# Patient Record
Sex: Female | Born: 1975 | Race: White | Hispanic: No | Marital: Single | Smoking: Never smoker
Health system: Western US, Academic
[De-identification: ages and names within clinical notes are randomized; demographics above are authoritative.]

---

## 2012-10-07 ENCOUNTER — Telehealth: Payer: Self-pay | Admitting: Family Medicine

## 2012-10-07 ENCOUNTER — Ambulatory Visit: Payer: Self-pay | Admitting: Family Medicine

## 2012-10-07 ENCOUNTER — Encounter: Payer: Self-pay | Admitting: Family Medicine

## 2012-10-07 ENCOUNTER — Ambulatory Visit: Payer: Self-pay

## 2012-10-07 VITALS — BP 104/60 | HR 64 | Temp 98.8°F | Resp 16 | Ht 63.5 in | Wt 174.4 lb

## 2012-10-07 DIAGNOSIS — B1920 Unspecified viral hepatitis C without hepatic coma: Secondary | ICD-10-CM | POA: Insufficient documentation

## 2012-10-07 DIAGNOSIS — F32A Depression, unspecified: Secondary | ICD-10-CM | POA: Insufficient documentation

## 2012-10-07 DIAGNOSIS — R519 Headache, unspecified: Secondary | ICD-10-CM | POA: Insufficient documentation

## 2012-10-07 DIAGNOSIS — G44209 Tension-type headache, unspecified, not intractable: Secondary | ICD-10-CM | POA: Insufficient documentation

## 2012-10-07 DIAGNOSIS — E282 Polycystic ovarian syndrome: Secondary | ICD-10-CM | POA: Insufficient documentation

## 2012-10-07 MED ORDER — ALPRAZOLAM 0.5 MG TABLET
0.5000 mg | ORAL_TABLET | Freq: Four times a day (QID) | ORAL | Status: DC | PRN
Start: 2012-10-07 — End: 2013-04-08

## 2012-10-07 MED ORDER — FLUTICASONE PROPIONATE 50 MCG/ACTUATION NASAL SPRAY,SUSPENSION
1.0000 | Freq: Every day | NASAL | Status: AC
Start: 2012-10-07 — End: 2013-10-07

## 2012-10-07 MED ORDER — ESCITALOPRAM 10 MG TABLET
10.0000 mg | ORAL_TABLET | Freq: Every day | ORAL | Status: DC
Start: 2012-10-07 — End: 2012-11-02

## 2012-10-07 NOTE — Telephone Encounter (Signed)
Patient called and stated that she would like to get in today. She is a new patient and has not yet been seen. Patient has been on medication Celexa for 2 years and now feels that the medication is not working and would like to discuss changing the medication. Please call and advise. No openings on schedule today for a 30 minute New patient slot or Morning A slot.     Carrington Clamp  [facility]

## 2012-10-07 NOTE — Nursing Note (Signed)
>>   Amber BRADFORD, MA     Wed Oct 07, 2012  4:13 PM  Vitals taken, allergies verified, screened for pain. [place], [place]

## 2012-10-07 NOTE — Patient Instructions (Addendum)
Vertigo: Exercises  Your Care Instructions  Here are some examples of typical rehabilitation exercises for your condition. Start each exercise slowly. Ease off the exercise if you start to have pain.  Your doctor or physical therapist will tell you when you can start these exercises and which ones will work best for you.  How to do the exercises  Note: Do these exercises twice a day. Try to progress to doing each head movement 15 to 20 times. Then try to do them with your eyes closed.  Exercise 1    1. Stand with a chair in front of you and a wall behind you. If you begin to fall, you may use them for support.  2. Stand with your feet together and your arms at your sides.  Exercise 2    Move your head side to side 10 times.  Exercise 3    Move your head diagonally up and down 10 times.  Exercise 4    Move your head diagonally up and down 10 times on the other side.  Follow-up care is a key part of your treatment and safety. Be sure to make and go to all appointments, and call your doctor if you are having problems. It's also a good idea to know your test results and keep a list of the medicines you take.   Where can you learn more?   Go to https://www.healthwise.net/patiented  Enter F349 in the search box to learn more about "Vertigo: Exercises."    2006-2013 Healthwise, Incorporated. Care instructions adapted under license by [facility]. This care instruction is for use with your licensed healthcare professional. If you have questions about a medical condition or this instruction, always ask your healthcare professional. Healthwise, Incorporated disclaims any warranty or liability for your use of this information.  Content Version: 9.7.145117; Last Revised: June 25, 2011

## 2012-10-07 NOTE — Progress Notes (Addendum)
Chief Complaint   Patient presents with    Establish Care        SUBJECTIVE: Amber Wang is a 37yr old female who presents to get established  Moved from [place] 2 months ago with fianc, children and mother is helping to care for her children.  She's working in and normal lab at [employer]  Father children is working at [employer]    She complains of considerable stress at work, supervising 14 people  Frequent tension headaches, also sinus pressure no nasal discharge    Recent diagnosis with hepatitis C in February found qualifying for life insurance  Reports elevated liver enzymes no focal symptoms, history of IV drug use for 3 months in the remote past    Reports panic attacks 3 times per week as well as generalized anxiety  Previously maintained citalopram 20 mg by mouth daily with good results then tachyphylaxis, self DC'd a couple months ago.     family history includes Arthritis in her mother and Hypertension in her father and sister.  History   Substance Use Topics    Smoking status: Never Smoker     Smokeless tobacco: Not on file    Alcohol Use: Not on file     Past Surgical History   Procedure Laterality Date    Rc c-section       Family History   Problem Relation Age of Onset    Hypertension Father     Hypertension Sister     Arthritis Mother      Patient Active Problem List   Diagnosis    Hepatitis C    PCOS (polycystic ovarian syndrome)    Headache disorder    Anxiety and depression         Current outpatient prescriptions:Fluticasone (FLONASE) 50 mcg/actuation nasal spray, Instill 1-2 sprays into EACH nostril every day. (allergies), Disp: 16 g, Rfl: 5     R.O.S. See new patient questionnaire    P. HQ 9 equal to 8  Gad 7 equal to 18  BP 104/60  Pulse 64  Temp(Src) 37.1 C (98.8 F) (Oral)  Resp 16  Ht 1.613 m (5' 3.5")  Wt 79.107 kg (174 lb 6.4 oz)  BMI 30.41 kg/m2  LMP 10/01/2012     OBJECTIVE: General Appearance: Alert, oriented, cooperative, no apparent distress.  Lungs: Good air  entry, no rales or wheezing.  Heart: Regular rate and rhythm. No murmurs, extra heart sounds.  No cyanosis, cords or edema  Mental status exam; she is alert, orient to time, person and place. Normal thought content, speech, affect, mood and dress are noted.    ASSESSMENT:    (070.70) Hepatitis C  (primary encounter diagnosis) with history of IV drug use x3 months in the remote past  Comment: New diagnosis, advised to avoid alcohol and Tylenol  Husband to get tested as well  Plan: HEPATOLOGY REFERRAL        Patient to transfer records    (784.0) Headache disorder  Comment: Likely combined tension and sinus  Plan: Treat anxiety as below  Flonase  Followup at CPE in 2 weeks    (386.11) Vertigo, benign positional  Comment: Probable  Plan: see patient education   Call if symptoms worsen, or fail to resolve as anticipated.     (300.4) Anxiety and depression  Comment: Begin Lexapro with alprazolam when necessary   Call if symptoms worsen, followup one to 2 weeks    30 minutes spent face-to-face reviewing history, examining patient, and coordinating  care.       I reviewed patient's past medical and social history.  Barriers to Learning assessed: none. Patient verbalizes understanding of teaching and instructions.  Orlene Salmons Alvie Heidelberg, MD

## 2012-10-07 NOTE — Telephone Encounter (Signed)
Called patient . She is at home today with her mother ,cannot be at work. She is experiencing severe anxiety attacks. Patient reported of recent Hep C diagnosis. Her current mediction not effective. She must have work letter for anxiety related issues . Patient is new and provided NP visit today . This is d/t severe anxiety attacks and patient decline to speak with nurse. Patient scheduled today at 1600,30 minutes .pt advised to arrive 15-20 minutes prior to appointment for registration.Madelon Lips, LVN

## 2012-11-02 ENCOUNTER — Encounter: Payer: Self-pay | Admitting: Family Medicine

## 2012-11-02 ENCOUNTER — Ambulatory Visit: Payer: Self-pay | Admitting: Family Medicine

## 2012-11-02 VITALS — BP 104/64 | HR 62 | Temp 98.9°F | Wt 181.4 lb

## 2012-11-02 MED ORDER — ESCITALOPRAM 10 MG TABLET
10.0000 mg | ORAL_TABLET | Freq: Every day | ORAL | Status: DC
Start: 2012-11-02 — End: 2013-11-02

## 2012-11-02 NOTE — Patient Instructions (Signed)
Ankle: Exercises  Your Care Instructions  Here are some examples of exercises for your ankle. Start each exercise slowly. Ease off the exercise if you start to have pain.  Your doctor or physical therapist will tell you when you can start these exercises and which ones will work best for you.  How to do the exercises  "Alphabet" exercise    1. Trace the alphabet with your toe. This helps your ankle move in all directions.  Side-to-side knee swing exercise    1. Sit in a chair with your foot flat on the floor.  2. Slowly move your knee from side to side while keeping your foot pressed flat.  3. Continue this exercise for 2 to 3 minutes.  Towel curl    1. While sitting, place your foot on a towel on the floor and scrunch the towel toward you with your toes.  2. Then use your toes to push the towel away from you.  3. Make this exercise more challenging by placing a weighted object, such as a soup can, on the other end of the towel.  Towel stretch    1. Sit with your legs extended and knees straight.  2. Place an elastic band or towel around your foot just under the toes. A towel will give you a more effective stretch.  3. Hold each end of the towel or band in each hand, with your hands above your knees.  4. Pull back with the towel or band so that your foot stretches toward you.  5. Hold the position for at least 15 to 30 seconds.  6. Repeat 2 to 4 times a session, up to 5 sessions a day.  Ankle eversion exercise    1. Start by sitting with your foot flat on the floor and pushing it outward against an immovable object such as the wall or heavy furniture.  2. After you feel comfortable with this, try using rubber tubing looped around the outside of your feet for resistance. Rubber tubing, also called surgical tubing, is like a big rubber band. When held firmly on one end, it offers resistance as you stretch the other side.  Isometric opposition exercises    1. While sitting, put your feet together flat on the  floor.  2. Press your injured foot inward against your other foot.  3. Hold for about 6 seconds, and relax.  4. Then place the heel of your other foot on top of the injured one. Push down with the top heel while trying to push up with your injured foot.  Follow-up care is a key part of your treatment and safety. Be sure to make and go to all appointments, and call your doctor if you are having problems. It's also a good idea to know your test results and keep a list of the medicines you take.   Where can you learn more?   Go to [employer]  Enter R730 in the search box to learn more about "Ankle: Exercises."    2006-2013 Healthwise, Incorporated. Care instructions adapted under license by [facility]. This care instruction is for use with your licensed healthcare professional. If you have questions about a medical condition or this instruction, always ask your healthcare professional. Healthwise, Incorporated disclaims any warranty or liability for your use of this information.  Content Version: 9.7.145117; Last Revised: March 09, 2010              Ankle Sprain: After Your Visit  Your Care Instructions  An ankle sprain can happen when you twist your ankle. The ligaments that support the ankle can get stretched and torn. Often the ankle is swollen and painful.  Ankle sprains may take from several weeks to several months to heal. Usually, the more pain and swelling you have, the more severe your ankle sprain is and the longer it will take to heal. You can heal faster and regain strength in your ankle with good home treatment.  It is very important to give your ankle time to heal completely, so that you do not easily hurt your ankle again.  Follow-up care is a key part of your treatment and safety. Be sure to make and go to all appointments, and call your doctor if you are having problems. It's also a good idea to know your test results and keep a list of the medicines you  take.  How can you care for yourself at home?   Prop up your foot on pillows as much as possible for the next 3 days. Try to keep your ankle above the level of your heart. This will help reduce the swelling.   Follow your doctor's directions for wearing a splint or elastic bandage. Wrapping the ankle may help reduce or prevent swelling.   Your doctor may give you a splint, a brace, an air stirrup, or another form of ankle support to protect your ankle until it is healed. Wear it as directed while your ankle is healing. Do not remove it unless your doctor tells you to. After your ankle has healed, ask your doctor whether you should wear the brace when you exercise.   Put ice or cold packs on your injured ankle for 10 to 20 minutes at a time. Try to do this every 1 to 2 hours for the next 3 days (when you are awake) or until the swelling goes down. Put a thin cloth between the ice and your skin.   You may need to use crutches until you can walk without pain. If you do use crutches, try to bear some weight on your injured ankle if you can do so without pain. This helps the ankle heal.   Take pain medicines exactly as directed.   If the doctor gave you a prescription medicine for pain, take it as prescribed.   If you are not taking a prescription pain medicine, ask your doctor if you can take an over-the-counter medicine.   If you have been given ankle exercises to do at home, do them exactly as instructed. These can promote healing and help prevent lasting weakness.  When should you call for help?  Call your doctor now or seek immediate medical care if:   Your pain is getting worse.   Your swelling is getting worse.   Your splint feels too tight or you are unable to loosen it.  Watch closely for changes in your health, and be sure to contact your doctor if:   You are not getting better after 1 week.   Where can you learn more?   Go to [employer]  Enter [phone] in the search box to  learn more about "Ankle Sprain: After Your Visit."    2006-2013 Healthwise, Incorporated. Care instructions adapted under license by [facility]. This care instruction is for use with your licensed healthcare professional. If you have questions about a medical condition or this instruction, always ask your healthcare professional. Healthwise, Incorporated disclaims any warranty or liability  for your use of this information.  Content Version: 9.7.145117; Last Revised: Sep 21, 2009              Acute Low Back Pain: Exercises  Your Care Instructions  Here are some examples of typical rehabilitation exercises for your condition. Start each exercise slowly. Ease off the exercise if you start to have pain.  Your doctor or physical therapist will tell you when you can start these exercises and which ones will work best for you.  When you are not being active, find a comfortable position for rest. Some people are comfortable on the floor or a medium-firm bed with a small pillow under their head and another under their knees. Some people prefer to lie on their side with a pillow between their knees. Don't stay in one position for too long.  Take short walks (10 to 20 minutes) every 2 to 3 hours. Avoid slopes, hills, and stairs until you feel better. Walk only distances you can manage without pain, especially leg pain.  How to do the exercises  Back stretches    1. Get down on your hands and knees on the floor.  2. Relax your head and allow it to droop. Round your back up toward the ceiling until you feel a nice stretch in your upper, middle, and lower back. Hold this stretch for as long as it feels comfortable, or about 15 to 30 seconds.  3. Return to the starting position with a flat back while you are on your hands and knees.  4. Let your back sway by pressing your stomach toward the floor. Lift your buttocks toward the ceiling.  5. Hold this position for 15 to 30 seconds.  6. Repeat 2 to 4 times.  Follow-up  care is a key part of your treatment and safety. Be sure to make and go to all appointments, and call your doctor if you are having problems. It's also a good idea to know your test results and keep a list of the medicines you take.   Where can you learn more?   Go to [employer]  Enter Z071 in the search box to learn more about "Acute Low Back Pain: Exercises."    2006-2013 Healthwise, Incorporated. Care instructions adapted under license by [facility]. This care instruction is for use with your licensed healthcare professional. If you have questions about a medical condition or this instruction, always ask your healthcare professional. Healthwise, Incorporated disclaims any warranty or liability for your use of this information.  Content Version: 9.7.145117; Last Revised: March 20, 2010

## 2012-11-02 NOTE — Nursing Note (Signed)
>>   STACEY RIOS, MA     Mon Nov 02, 2012  9:06 AM  Vital signs taken,tobacco,allergies,pharmacy verified, screened for pain.  [place], [place]

## 2012-11-02 NOTE — Progress Notes (Signed)
11/02/2012   Chief Complaint   Patient presents with    Physical       Amber Wang is a 37yr old female presents for complete physical exam   History of PCO reports history of intermittent right ovarian cyst  Complains of right lower quadrant pain and right buttock   Right ankle inversion yesterday while moving 4/10 pain    Patient Active Problem List   Diagnosis    Hepatitis C    PCOS (polycystic ovarian syndrome)    Headache disorder    Anxiety and depression       Family History   Problem Relation Age of Onset    Hypertension Father     Hypertension Sister     Arthritis Mother      Social History     Social History    Marital Status: SINGLE     Spouse Name: N/A     Number of Children: N/A    Years of Education: N/A     Occupational History    Not on file.     Social History Main Topics    Smoking status: Never Smoker     Smokeless tobacco: Not on file    Alcohol Use: Not on file    Drug Use: Not on file    Sexually Active: Not on file         Review of Systems - see history of present illness.  Also:  Constitutional: no excessive/unexplained fatigue, no unexplained weight loss.  Eyes: no redness or discharge.  no acute vision changes  Ears, Nose, Mouth, Throat: no persistent sore throat, rhinorrhea, ear pain.  CV: no palpitations, chest pain.  Resp: no cough, wheezing.  GI: no dysphagia, change in bowel movements.  GU: no dysuria, urgency.  Musculoskeletal: no AM joint stiffness, muscle weakness.  Integumentary: no moles that have changed, dark lesions.  Neuro: no paralysis/weakness, frequent or severe head aches.  Psych: no depressed mood, anxiety.  Endo: no polydipsia, polyuria.  Heme/Lymphatic: no abnormal bleeding, abnormal bruising from baseline.  Allergy/Immun: no itchy eyes, itchy nose.  GYN: no bleeding between periods, lump in breast.    Patient Active Problem List   Diagnosis    Hepatitis C    PCOS (polycystic ovarian syndrome)    Headache disorder    Anxiety and depression       Current outpatient prescriptions:Alprazolam (XANAX) 0.5 mg Tablet Tablet, Take 1 tablet by mouth every 6 hours if needed (anxiety)., Disp: 20 tablet, Rfl: 0;  Escitalopram (LEXAPRO) 10 mg Tablet, Take 1 tablet by mouth every morning., Disp: 30 tablet, Rfl: 11;  Fluticasone (FLONASE) 50 mcg/actuation nasal spray, Instill 1-2 sprays into EACH nostril every day. (allergies), Disp: 16 g, Rfl: 5   BP 104/64  Pulse 62  Temp(Src) 37.2 C (98.9 F) (Oral)  Wt 82.283 kg (181 lb 6.4 oz)  BMI 31.63 kg/m2  LMP 10/07/2012   GEN: The physical exam is generally normal. Patient appears well, alert and oriented x 3, pleasant, cooperative.   VITALS:  are as noted by nurse.   NECK:  supple and free of adenopathy, or masses. No thyromegaly.   Musculoskeletal: Cervical, thoracic and lumbar spine exam appear normal   EYES: sclera non-injected, no discharge   Ears, Nose, and Throat are normal.   Chest is clear to IPPA.   Heart sounds are normal, no murmurs, clicks, gallops or rubs.  Abdomen is soft, no tenderness, masses or hepatosplenomegaly.    Breasts:  normal without  suspicious masses, skin or nipple changes or axillary nodes, symmetric fibrous changes in both upper outer quadrants, self-exam is taught and encouraged.   Pelvis: no  tenderness,uterus normal size, shape, consistency, no mass or tenderness,adnexa normal in size without mass or tenderness. Pap was not obtained.  Extremities no pitting edema, pulses within normal limits .   Screening neurological exam is normal without focal findings.   Skin is normal without suspicious lesions noted.   Psych: patient appears euthymic without pressured speech.    No results found for any previous visit.     ASSESSMENT:     (V72.31) Well woman exam with routine gynecological exam  (primary encounter diagnosis)  Comment: pap up to date per patient   Plan: patient to request records,she understands we can  review these at a requested office visit or ather  next physical    (300.4)  Anxiety and depression, rare need for xanax  Comment: improved on SSRI  Plan: continue lexapro 10 mg / day  Decline counseling at this time  Call if symptoms worsen, or fail to improve as anticipated.     Ankle sprain, mild   See patient education   Call if symptoms worsen, or fail to resolve as anticipated.     Low back pain , chronic  See patient education   Call if symptoms worsen, or fail to improve as anticipated.     RLQ pain, exam benign  If symptoms increase or persist post menses, recommend repeat pelvic ultrasound.    I reviewed and updated patient's past medical, family and social and history.  Barriers to Learning assessed: none. Patient verbalizes understanding of teaching and instructions.  Leeana Creer Alvie Heidelberg, MD

## 2013-04-08 ENCOUNTER — Ambulatory Visit: Payer: Commercial Managed Care - HMO | Admitting: FAMILY PRACTICE

## 2013-04-08 VITALS — BP 120/80 | HR 82 | Temp 98.1°F | Resp 12 | Wt 155.5 lb

## 2013-04-08 MED ORDER — TRAZODONE 50 MG TABLET
50.0000 mg | ORAL_TABLET | Freq: Every day | ORAL | Status: DC
Start: 2013-04-08 — End: 2013-12-30

## 2013-04-08 MED ORDER — ALPRAZOLAM 0.5 MG TABLET
0.5000 mg | ORAL_TABLET | Freq: Four times a day (QID) | ORAL | Status: DC | PRN
Start: 2013-04-08 — End: 2013-12-30

## 2013-04-08 NOTE — Patient Instructions (Addendum)
You may call [phone] or [phone] to schedule your gastroenterology appointment for hepatitis C evaluation.      Take Lexapro daily.  Take trazodone and xanax as needed only.

## 2013-04-08 NOTE — Nursing Note (Signed)
>>   Madelon Lips, LVN     Thu Apr 08, 2013  3:51 PM  Vital signs taken, allergies verified, screened for pain, med hx taken (if appropriate),   and verified immunization status.  Madelon Lips, LVN,

## 2013-04-08 NOTE — Progress Notes (Signed)
3:56 PM       Chief Complaint   Patient presents with    Anxiety      medication follow up    Referral      change to closer location for hepatology     PI  Amber Wang is a 33yr female who presents for a new problem of increased anxiety and now for the first time, depression.   She has had panic attacks daily again x 2 months now after none for several months.  Xanax was helpful.   She has hepatitis C noted earlier this year for life insurance.   Lexapro has been helpful but seems not to be working now.   Thinks she cannot fit in time for counseling.    Never had panic attack until 2 years ago before big speech.  Used 1/2 tablet Xanax with relief.   Lost 20 lbs since June due to separation and exercise, she states.      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  No changes the next two months.  "I feel like a failure and sad all the time."      REVIEW OF SYSTEMS  PHQ-9 score 18 extremely difficult   MEDICATIONS AS LISTED BEFORE TODAY'S REVIEW  Current outpatient prescriptions:Escitalopram (LEXAPRO) 10 mg Tablet, Take 1 tablet by mouth every morning., Disp: 30 tablet, Rfl: 11;  Fluticasone (FLONASE) 50 mcg/actuation nasal spray, Instill 1-2 sprays into EACH nostril every day. (allergies), Disp: 16 g, Rfl: 5    Patient Active Problem List    Diagnosis Date Noted    Hepatitis C 10/07/2012     Overview Note:     Diagnosed 06/2012      PCOS (polycystic ovarian syndrome) 10/07/2012    Headache disorder 10/07/2012    Anxiety and depression 10/07/2012       OBJECTIVE:        BP 120/80  Pulse 82  Temp(Src) 36.7 C (98.1 F) (Oral)  Resp 12  Wt 70.534 kg (155 lb 8 oz)  BMI 27.11 kg/m2  LMP 03/11/2013      GEN'L  APPEARANCE - healthy young woman in no acute distress    (070.70) Hepatitis C  (primary  encounter diagnosis)  Plan: HCV VIRAL LOAD          (300.4) Anxiety and depression  Plan: Alprazolam (XANAX) 0.5 mg Tablet Tablet; continue Lexapro.  Call if symptoms not improving.  Symptoms likely to improve once marital situation improves.          (780.52) Insomnia  Plan: Trazodone (DESYREL) 50 mg Tablet         Barriers to Learning: none.     Patient/Family Understanding: verbalizes.  Norval Gable, MD    4:16 PM

## 2013-04-19 ENCOUNTER — Encounter: Payer: Self-pay | Admitting: Family Medicine

## 2013-06-16 ENCOUNTER — Ambulatory Visit: Payer: Commercial Managed Care - HMO | Admitting: FAMILY PRACTICE

## 2013-12-30 ENCOUNTER — Encounter: Payer: Self-pay | Admitting: Family Medicine

## 2013-12-30 ENCOUNTER — Ambulatory Visit (INDEPENDENT_AMBULATORY_CARE_PROVIDER_SITE_OTHER): Payer: Commercial Managed Care - HMO

## 2013-12-30 ENCOUNTER — Ambulatory Visit: Payer: Commercial Managed Care - HMO | Admitting: Family Medicine

## 2013-12-30 ENCOUNTER — Ambulatory Visit
Admission: RE | Admit: 2013-12-30 | Discharge: 2013-12-30 | Disposition: A | Discharge status: Home / Self Care | Payer: Commercial Managed Care - HMO | Source: Ambulatory Visit | Attending: Family Medicine | Admitting: Family Medicine

## 2013-12-30 VITALS — BP 114/64 | HR 64 | Temp 97.6°F | Resp 16 | Ht 64.0 in | Wt 151.6 lb

## 2013-12-30 DIAGNOSIS — F341 Dysthymic disorder: Secondary | ICD-10-CM

## 2013-12-30 DIAGNOSIS — B1920 Unspecified viral hepatitis C without hepatic coma: Principal | ICD-10-CM

## 2013-12-30 DIAGNOSIS — S9030XA Contusion of unspecified foot, initial encounter: Principal | ICD-10-CM | POA: Insufficient documentation

## 2013-12-30 DIAGNOSIS — F419 Anxiety disorder, unspecified: Principal | ICD-10-CM

## 2013-12-30 DIAGNOSIS — F32A Depression, unspecified: Secondary | ICD-10-CM

## 2013-12-30 DIAGNOSIS — F329 Major depressive disorder, single episode, unspecified: Principal | ICD-10-CM

## 2013-12-30 MED ORDER — ESCITALOPRAM 20 MG TABLET
20.0000 mg | ORAL_TABLET | Freq: Every day | ORAL | Status: DC
Start: 2013-12-30 — End: 2015-01-08

## 2013-12-30 MED ORDER — ALPRAZOLAM 0.5 MG TABLET
0.5000 mg | ORAL_TABLET | Freq: Every day | ORAL | Status: AC | PRN
Start: 2013-12-30 — End: 2014-03-30

## 2013-12-30 NOTE — Progress Notes (Signed)
S: This is a 51yr female with pmhx of Anxiety, Hepatitis C(Hx of IVDU), PCOS who presents with cc of Anxiety.  Duration: 2-3 years  Character: panic attacks  Worsening Factors: stress  Relieving Factors: Alprazolam 0.25 mg 1/2 tab prn  Previous Treatment: As above, Escitalopram 10 mg daily    ROS: +Left foot pain after droping object on the foot 3 weeks ago, Denies any Weight Loss, Anorexia, Difficulty Sleeping, Fatigue, Dyssomnia, Agoraphobia, Anhedonia, Decreased concentration, Memory Loss, Depressed Mood, Suicidal ideations.    Social History: Tobacco use Denies, ETOH use Denies, Illicit drug use Denies, Occupation: Passenger transport manager, Lives with boyfriend + 2 children    No Known Allergies    O: Temp: 36.4 C (97.6 F) (08/27 1600)  Temp src: Tympanic (08/27 1600)  Pulse: 64 (08/27 1600)  BP: 114/64 mmHg (08/27 1600)  Resp: 16 (08/27 1600)  SpO2: --  Height: 162.6 cm ( ) (08/27 1600)  Weight: 68.765 kg (151 lb 9.6 oz) (08/27 1600)    GEN: This is a 63yr female in NAD. Patient appears non-toxic.  NECK: No palpable masses, No thyromegaly  LUNG: CTAB, No wheezes, rhonchi or rales.  CVS: RR, No murmurs clicks or rubs.  MUSCULO: Left foot ROM is without restriction,+TTP over the mid 5th metatarsal, There is no ecchymosis, hematoma or effusion   PSYCH:   Appearance: neat   General behavior: non-combative, cooperative   Attitude torward examiner: interested   Mood: pleasant,sad   Affect: euthymic   Speech: soft tone, articulate, normal volume   Insight: Good      A/P: Anxiety, Hepatitis C, Left Foot Contusion     EHR reviewed   Escitalopram 20 mg daily+Alprazolam 0.25 mg daily prn   Side effects as well as risks of benzodiazepines overuse discussed     Hep C abs, Hep Viral load pending   Previously vaccinated for Hep B       Left foot film pending   RICE therapy   Treatment plan including medications were reviewed with the patient     RTO in 1 month for recheck     I did review patients past medical and family/social history, no changes noted.  Barriers to Learning assessed: none. Patient verbalizes understanding of teaching and instructions.  Huntley Dec, DO

## 2013-12-30 NOTE — Nursing Note (Signed)
Vitals taken, allergies verified and pain screened.  Quetzaly Ebner, MA

## 2013-12-31 LAB — HEPATITIS C AB SCREEN: HEPATITIS C AB SCREEN: REACTIVE — AB

## 2014-01-03 ENCOUNTER — Other Ambulatory Visit: Payer: Self-pay | Admitting: Family Medicine

## 2014-01-03 DIAGNOSIS — B1920 Unspecified viral hepatitis C without hepatic coma: Principal | ICD-10-CM

## 2014-01-03 LAB — HCV VIRAL LOAD: HCV VIRAL LOAD: 1587841 [IU]/mL

## 2014-01-04 ENCOUNTER — Ambulatory Visit: Payer: Commercial Managed Care - HMO | Admitting: GASTROENTEROLOGY

## 2014-01-08 ENCOUNTER — Encounter: Payer: Self-pay | Admitting: Family Medicine

## 2014-01-11 ENCOUNTER — Ambulatory Visit: Payer: Commercial Managed Care - HMO | Admitting: GASTROENTEROLOGY

## 2014-01-11 ENCOUNTER — Encounter: Payer: Self-pay | Admitting: GASTROENTEROLOGY

## 2014-01-11 VITALS — BP 122/73 | HR 65 | Temp 98.4°F | Resp 18 | Wt 155.0 lb

## 2014-01-11 DIAGNOSIS — B1920 Unspecified viral hepatitis C without hepatic coma: Principal | ICD-10-CM

## 2014-01-11 NOTE — Progress Notes (Signed)
Department of Internal Medicine  Division of Gastroenterology and Hepatology    Hepatology Clinic      NAME: Amber Wang  DOB: 16-Mar-1976  MR#: [id number]       Referring Physician: Lolita Rieger, MD    Source of Information: History was obtained from the patient and after review of medical records.    Chief Complaint: Chronic Hepatitis C    HPI:   Amber Wang is a 55yr year old female with chronic hepatitis C, unknown presents for initial evaluation. She recently moved here from [place]. She notes that she was tested and found to have HCV 1 year ago in [place] (her first knowledge of the test) but did not have a chance for follow-up. She was tested again following a [employer] which was denied and thus followed up. She was likely infected 10-11 years ago when she had used IV recreational drugs and also snorted cocaine. No h/o blood transfusions. She has two children both born after his h/o IVDU.    No past medical history on file.      Past Surgical History   Procedure Laterality Date    Rc c-section          Family History   Problem Relation Age of Onset    Hypertension Father     Hypertension Sister     Arthritis Mother        History     Social History    Marital Status: SINGLE     Spouse Name: N/A     Number of Children: N/A    Years of Education: N/A     Social History Main Topics    Smoking status: Never Smoker     Smokeless tobacco: Not on file    Alcohol Use: Not on file    Drug Use: Not on file    Sexual Activity: Not on file     Other Topics Concern    Not on file     Social History Narrative    Married     Mother of two    Lab Human resources officer       Current Outpatient Prescriptions on File Prior to Visit   Medication Sig Dispense Refill    Alprazolam (XANAX) 0.5 mg Tablet Tablet Take 1 tablet by mouth once daily if needed (anxiety). 20 tablet 0    Escitalopram (LEXAPRO) 20 mg tablet Take 1 tablet by mouth every day. 90 tablet 1     No current facility-administered medications  on file prior to visit.       No Known Allergies        ROS: A complete review of systems was performed and is negative except as mentioned above.    Physical Exam:  BP 122/73 mmHg   Pulse 65   Temp(Src) 36.9 C (98.4 F) (Tympanic)   Resp 18   Wt 70.308 kg (155 lb)  Gen: NAD  HEENT: sclera anicteric  Heart: RRR, S1/S2 nl  Chest: CTAB  Abd: BS+, soft, NT  Ext: no pedal edema    Labs:  Labs reviewed  No results found for: WBC, HGB, HCT, PLT    Lab Results   Lab Name Value Date/Time    NA 138 11/02/2012 10:04 AM    K 4.9 11/02/2012 10:04 AM    CL 110 11/02/2012 10:04 AM    CO2 25 11/02/2012 10:04 AM    BUN 13 11/02/2012 10:04 AM    CR 0.75 11/02/2012 10:04 AM  GLU 94 11/02/2012 10:04 AM     Lab Results   Lab Name Value Date/Time    AST 30 11/02/2012 10:04 AM    ALT 37 11/02/2012 10:04 AM    ALP 41 11/02/2012 10:04 AM    ALB 3.6 11/02/2012 10:04 AM    TP 5.9* 11/02/2012 10:04 AM    TBIL 0.4 11/02/2012 10:04 AM     Lab Results   Component Value Date    HEPCABSCR REACTIVE* 12/30/2013         ASSESSMENT AND RECOMMENDATIONS:  Amber Wang is a 64yr year old female presents today for evaluation for chronic hepatitis C.    1) CHC:    -discussed plan to do labs and also fibroscan to evaluate for fibrosis   -discussed in detail current therapies for HCV based on genotype   -will discuss with our HCV team pharmacists to determine best course to obtain pre-authorization for HCV   -discuss with patient to consider testing her children for HCV    2) HAV/HBV immunization:   -will test for immunity    Celesta Aver, MD  Assistant Professor  Division of Gastroenterology and Hepatology

## 2014-01-11 NOTE — Nursing Note (Signed)
Vital signs taken, allergies verified, screened for pain, med hx taken,  screened for chicken pox, and verified immunization status.  Stefanie Murphy, MA

## 2014-01-12 ENCOUNTER — Encounter: Payer: Self-pay | Admitting: GASTROENTEROLOGY

## 2014-01-18 ENCOUNTER — Ambulatory Visit: Payer: Commercial Managed Care - HMO | Attending: FAMILY PRACTICE

## 2014-01-18 DIAGNOSIS — B1920 Unspecified viral hepatitis C without hepatic coma: Principal | ICD-10-CM | POA: Insufficient documentation

## 2014-01-18 LAB — CBC WITH DIFFERENTIAL
BASOPHILS % AUTO: 0.4 %
BASOPHILS ABS AUTO: 0 10*3/uL (ref 0–0.2)
EOSINOPHIL % AUTO: 2.2 %
EOSINOPHIL ABS AUTO: 0.1 10*3/uL (ref 0–0.5)
HEMATOCRIT: 33.4 % — AB (ref 34–46)
HEMOGLOBIN: 11.3 g/dL — AB (ref 12.0–16.0)
LYMPHOCYTE ABS AUTO: 1.9 10*3/uL (ref 1.0–4.8)
LYMPHOCYTES % AUTO: 33.6 %
MCH: 32.9 pg (ref 27–33)
MCHC: 34 % (ref 32–36)
MCV: 96.8 UM3 (ref 80–100)
MONOCYTES % AUTO: 7.2 %
MONOCYTES ABS AUTO: 0.4 10*3/uL (ref 0.1–0.8)
MPV: 9.5 UM3 (ref 6.8–10.0)
NEUTROPHIL ABS AUTO: 3.2 10*3/uL (ref 1.80–7.70)
NEUTROPHILS % AUTO: 56.6 %
PLATELET COUNT: 214 10*3/uL (ref 130–400)
RDW: 12.4 U (ref 0–14.7)
RED CELL COUNT: 3.45 10*6/uL — AB (ref 3.7–5.5)
WHITE BLOOD CELL COUNT: 5.7 10*3/uL (ref 4.5–11.0)

## 2014-01-18 LAB — COMPREHENSIVE METABOLIC PANEL
ALANINE TRANSFERASE (ALT): 38 U/L (ref 5–54)
ALBUMIN: 3.9 g/dL (ref 3.4–4.8)
ALKALINE PHOSPHATASE (ALP): 38 U/L (ref 35–115)
ASPARTATE TRANSAMINASE (AST): 36 U/L (ref 15–43)
BILIRUBIN TOTAL: 0.4 mg/dL (ref 0.3–1.3)
CALCIUM: 9 mg/dL (ref 8.6–10.5)
CARBON DIOXIDE TOTAL: 26 meq/L (ref 24–32)
CHLORIDE: 106 meq/L (ref 95–110)
CREATININE BLOOD: 0.88 mg/dL (ref 0.44–1.27)
GLUCOSE: 95 mg/dL (ref 70–99)
POTASSIUM: 3.7 meq/L (ref 3.3–5.0)
PROTEIN: 6.3 g/dL (ref 6.3–8.3)
SODIUM: 138 meq/L (ref 135–145)
UREA NITROGEN, BLOOD (BUN): 12 mg/dL (ref 8–22)

## 2014-01-18 LAB — INR: INR: 0.96 (ref 0.87–1.18)

## 2014-01-19 LAB — HEPATITIS A AB, IGG: HEPATITIS A AB, IGG: REACTIVE

## 2014-01-19 LAB — HEPATITIS B SURFACE ANTIGEN: HEPATITIS B SURFACE ANTIGEN: NONREACTIVE

## 2014-01-19 LAB — HEPATITIS B CORE AB, TOTAL: HEPATITIS B CORE AB, TOTAL: NONREACTIVE

## 2014-01-19 LAB — HEPATITIS B SURFACE ANTIBODY: HEPATITIS B SURFACE AB: NONREACTIVE

## 2014-01-19 LAB — HIV AG/AB COMBO SCREEN: HIV AG/AB COMBO SCREEN: NONREACTIVE

## 2014-01-21 ENCOUNTER — Ambulatory Visit: Payer: Commercial Managed Care - HMO | Attending: GASTROENTEROLOGY | Admitting: GASTROENTEROLOGY

## 2014-01-21 VITALS — BP 103/67 | HR 57 | Temp 96.7°F | Ht 64.0 in | Wt 150.0 lb

## 2014-01-21 DIAGNOSIS — B1920 Unspecified viral hepatitis C without hepatic coma: Secondary | ICD-10-CM

## 2014-01-21 DIAGNOSIS — B182 Chronic viral hepatitis C: Principal | ICD-10-CM | POA: Insufficient documentation

## 2014-01-21 LAB — HCV VIRAL LOAD: HCV VIRAL LOAD: 1197308 [IU]/mL

## 2014-01-21 NOTE — Progress Notes (Signed)
Pharmacist Initial Visit  Clinical Pharmacy HCV Treatment Introduction and HCV Treatment Staging    Amber Wang is a 38yr old female with HCV genotype unknown, treatment naive. Fibrosis score unknown.     Significant PMH  Depression/anxiety  Headaches    Educated Ms. Amber Wang, on the role of clinical pharmacist during her Hepatitis C therapy including facilitating medication access, managing side effects, monitoring lab values, ensuring medication adherence, and to answer any non-urgent questions relating to Hepatitis C treatment.  Home medications,OTC and herbal supplements reviewed. Drug-drug interactions screened and no significant interactions were identified with home medications.    Educated patient on HCV modes of transmission and to avoid sharing needles, intranasal drug use, sharing razors/toothbrushes, tattoos, or unprotected sex. Remote hx of IVDU/intranasal use. Educated patient to avoid ETOH, smoking or other agents that may further damage the liver.  Patient has 2 young children, who have not yet been tested for HCV. Recommend that children are tested for HCV. Patient expressed understanding.      Patient denies any cardiac hx and denies any hx of asthma/COPD    Ok to leave a detailed messaged on VM    Results for CEANA, FIALA (MRN [id number]) as of 01/21/2014 10:52   Ref. Range 01/18/2014 13:07   HEPATITIS B CORE Ab, TOTAL Latest Range: Nonreactive  Nonreactive   HEPATITIS B SURFACE Ab Latest Range: SEE COMMENT  NONREACTIVE   HEPATITIS B SURFACE ANTIGEN Latest Range: Nonreactive  Nonreactive   HEPATITIS A AB, IGG Latest Range: SEE COMMENT  REACTIVE   HIV AG/AB COMBO SCREEN Latest Range: Nonreactive  Nonreactive     Assessment: 38yr old female with HCV genotype unknown, treatment naive. Fibrosis score unknown. Patient would benefit from HCV treatment. Patient is not immune to HBV.    Plan:   1) Fibroscan ordered.   2) Patient completed baseline labs. HCV genotype pending.    3) Recommend patient complete HBV vaccination. Patient plans to start at next clinic visit.  4) Will f/u with provider and patient to discuss treatment options once the above is completed   5) Instructed patient to call clinical pharmacist at [phone] or [phone] [option 2] for non-urgent questions.      Rolla Flatten, PharmD  Hepatology/Infectious Diseases Clinical Pharmacist  [phone]        Time spent counseling and educating patient: 10 minutes

## 2014-01-21 NOTE — Progress Notes (Signed)
Department of Internal Medicine  Division of Gastroenterology and Hepatology    Hepatology Clinic      NAME: Amber Wang  DOB: 1975-12-20  MR#: [id number]       Referring Physician: Lolita Rieger, MD    Source of Information: History was obtained from the patient and after review of medical records.    Chief Complaint: Chronic Hepatitis C; Patient seen by me at the [facility] GI/Hepatology clinic, here for planning HCV therapy    HPI:   Amber Wang is a 6yr year old female with chronic hepatitis C, unknown presents for initial evaluation. She recently moved here from [place]. She notes that she was tested and found to have HCV 1 year ago in [place] (her first knowledge of the test) but did not have a chance for follow-up. She was tested again following a [employer] which was denied and thus followed up. She was likely infected 10-11 years ago when she had used IV recreational drugs and also snorted cocaine. No h/o blood transfusions. She has two children both born after her h/o IVDU.    Labs done, HCV genotype pending. She otherwise feels well, no h/o N/V abdominal pain, no jaundice, hematemesis.    No past medical history on file.      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 20 tablet 0    Escitalopram (LEXAPRO) 20 mg tablet Take 1 tablet by mouth every day. 90 tablet 1     No current facility-administered medications on file prior to visit.       No Known Allergies        ROS: A complete review of systems was performed and is negative except as mentioned above.    Physical Exam:  BP 103/67 mmHg   Pulse 57   Temp(Src) 35.9 C (96.7 F) (Tympanic)   Ht 1.626 m ( )   Wt 68.04 kg (150 lb)   BMI 25.73 kg/m2  Gen: NAD  HEENT: sclera anicteric  Heart: RRR, S1/S2 nl  Chest: CTAB  Abd: BS+, soft, NT  Ext: no pedal edema    Labs:  Labs reviewed  Lab Results   Lab Name Value Date/Time    WBC 5.7 01/18/2014 01:07 PM    HGB 11.3* 01/18/2014 01:07 PM  HCT 33.4* 01/18/2014 01:07 PM    PLT 214 01/18/2014 01:07 PM       Lab Results   Lab Name Value Date/Time    NA 138 01/18/2014 01:07 PM    K 3.7 01/18/2014 01:07 PM    CL 106 01/18/2014 01:07 PM    CO2 26 01/18/2014 01:07 PM    BUN 12 01/18/2014 01:07 PM    CR 0.88 01/18/2014 01:07 PM    GLU 95 01/18/2014 01:07 PM     Lab Results   Lab Name Value Date/Time    AST 36 01/18/2014 01:07 PM    ALT 38 01/18/2014 01:07 PM    ALP 38 01/18/2014 01:07 PM    ALB 3.9 01/18/2014 01:07 PM    TP 6.3 01/18/2014 01:07 PM    TBIL 0.4 01/18/2014 01:07 PM     Lab Results   Component Value Date    HBSAG Nonreactive 01/18/2014    HEPCABSCR REACTIVE* 12/30/2013         ASSESSMENT AND RECOMMENDATIONS:  Amber Wang is a 61yr year old female presents today for evaluation for chronic hepatitis C.    1) CHC:    -awaiting fibroscan   -discussed in detail current therapies for HCV based on genotype    2) HAV/HBV immunization:   -immune to HAV; will start vaccination for HBV    Celesta Aver, MD  Assistant Professor  Division of Gastroenterology and Hepatology

## 2014-01-21 NOTE — Nursing Note (Signed)
Patient is in room #3    Medication list given at the time of check in for review by patient, instructed patient to indicate to the doctor any corrections that need to be made for Medication Reconciliation.    Patient identifiers obtained. vital signs taken, screened for pain, allergies checked, patient roomed, and Amber Sarkar, MD notified.     [name], MA

## 2014-01-27 LAB — HCV GENOTYPE

## 2014-01-30 ENCOUNTER — Other Ambulatory Visit: Payer: Self-pay

## 2014-05-10 ENCOUNTER — Ambulatory Visit: Payer: Commercial Managed Care - HMO | Admitting: GASTROENTEROLOGY

## 2014-11-13 ENCOUNTER — Encounter: Payer: Self-pay | Admitting: GASTROENTEROLOGY

## 2015-01-03 ENCOUNTER — Other Ambulatory Visit: Payer: Self-pay | Admitting: Family Medicine

## 2015-01-03 NOTE — Telephone Encounter (Signed)
Called the patient at both number in the patient chart.  Both of the patient numbers are out of service.   If the patient calls regarding her message please advise the patient of Cydney Alvie Heidelberg, MD message.  Please ask the patient  All of  the questions that Cydney Alvie Heidelberg, MD is asking.  Ellouise Newer  MA I

## 2015-01-03 NOTE — Telephone Encounter (Signed)
Received refill request for  Escitalopram 20 mg. Medication is not on current medication list. Raelene Bott, MA I

## 2015-01-03 NOTE — Telephone Encounter (Signed)
How long has she been on this?  Is she resuming it now?  How is she doing?  I can potentially small prescription according to be answered to these questions, however she will need to schedule a followup visit and / or complete physical exam . Amber Mersereau Alvie Heidelberg, MD

## 2015-01-04 NOTE — Telephone Encounter (Signed)
Called patient no answer left voicemail to call back.  Operator: please relay MD's message and assist with scheduling. Nolie Bignell, MA I

## 2015-01-08 ENCOUNTER — Other Ambulatory Visit: Payer: Self-pay | Admitting: Family Medicine

## 2015-01-08 DIAGNOSIS — F329 Major depressive disorder, single episode, unspecified: Principal | ICD-10-CM

## 2015-01-08 DIAGNOSIS — F419 Anxiety disorder, unspecified: Principal | ICD-10-CM

## 2015-01-11 NOTE — Telephone Encounter (Addendum)
FYI:    Called patient primary number is out of service,    Called emergency contact, no answer unable to leave voicemail [place], [place] I

## 2015-01-19 NOTE — Telephone Encounter (Addendum)
Sent MyChart message to patient advising her to call office to receive Dr. Thomes Lolling message and update her phone numbers.  OPERATOR:  Please ask MD's questions, update contact numbers and schedule appointment.  Thank you, Littie Deeds, MA1    How long has she been on this?  Is she resuming it now?  How is she doing?  I can potentially small prescription according to be answered to these questions, however she will need to schedule a followup visit and / or complete physical exam . Cydney Alvie Heidelberg, MD

## 2015-12-01 ENCOUNTER — Telehealth: Payer: Self-pay

## 2015-12-01 NOTE — Telephone Encounter (Signed)
Encounter opened in error

## 2015-12-02 ENCOUNTER — Encounter: Payer: Self-pay | Admitting: Pharmacist

## 2015-12-02 NOTE — Progress Notes (Signed)
Patient was last seen in clinic 01/2014 and no showed appointment 05/2014. Multiple attempts have been made to try to reach this patient via phone and letter. Letter that was sent was returned to [place]. As we are not able to reach her, we will discharge her from the clinical pharmacy HCV clinic.    Rolla Flatten, PharmD  Hepatology/Infectious Diseases Clinical Pharmacist  [phone]

## 2019-09-11 IMAGING — US US ABDOMEN COMPLETE
1 series · 13 of 25 positions shown · non-contrast
Comparison: None.

HISTORY: 43-year-old female with chronic viral hepatitis C.
TECHNIQUE: Using real-time and Doppler ultrasound, the abdomen was evaluated.

[Series 1: us abdomen complete · 13 of 76 slices shown]
[im 1/76]
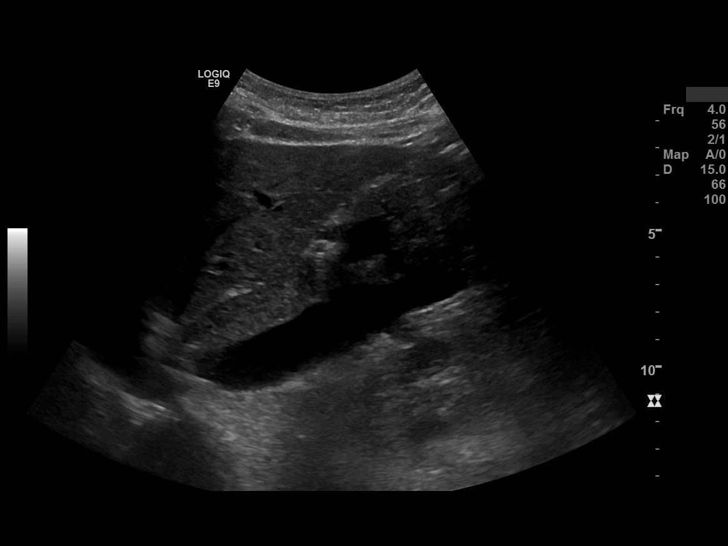
[im 7/76]
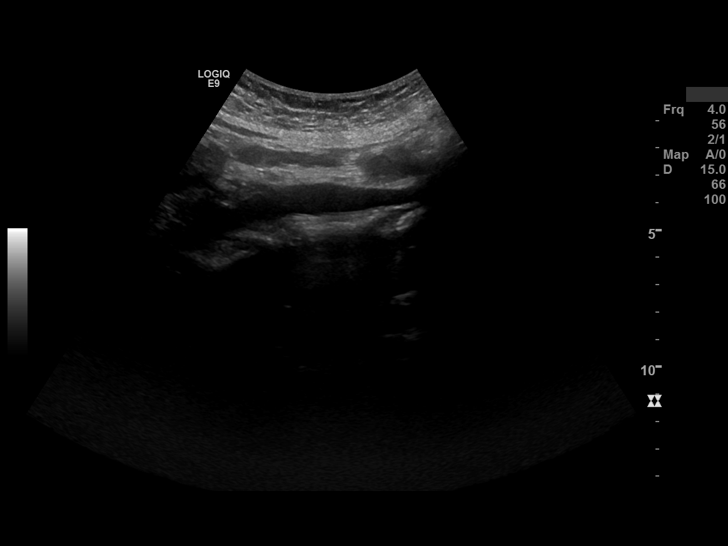
[im 13/76]
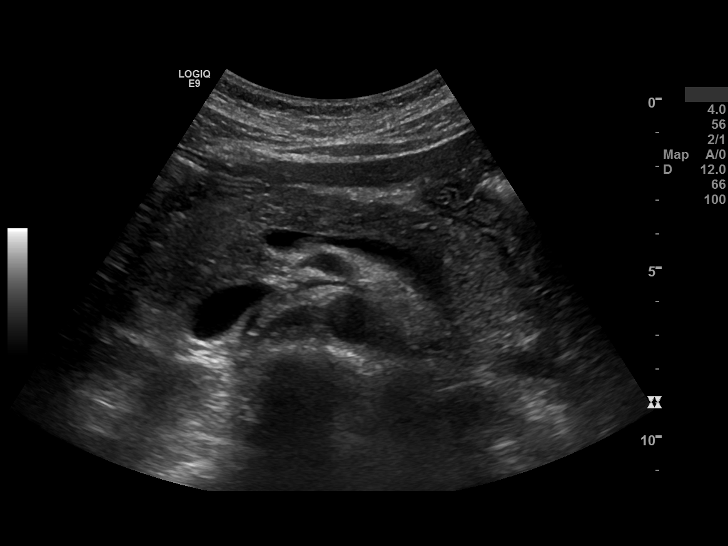
[im 19/76]
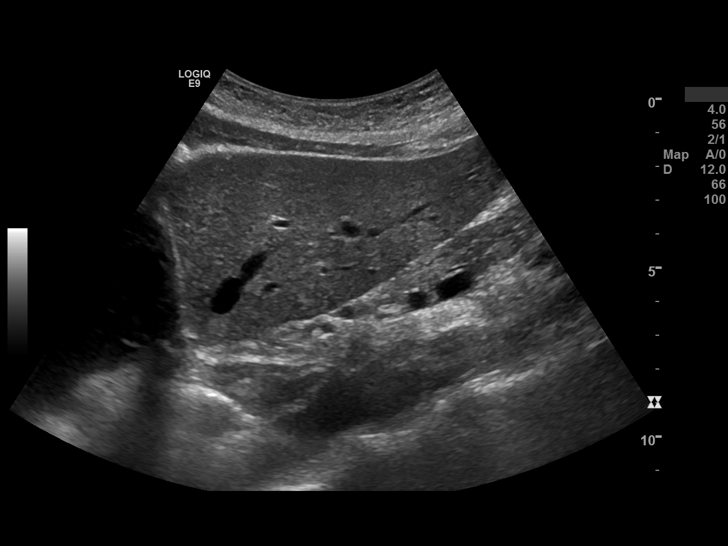
[im 26/76]
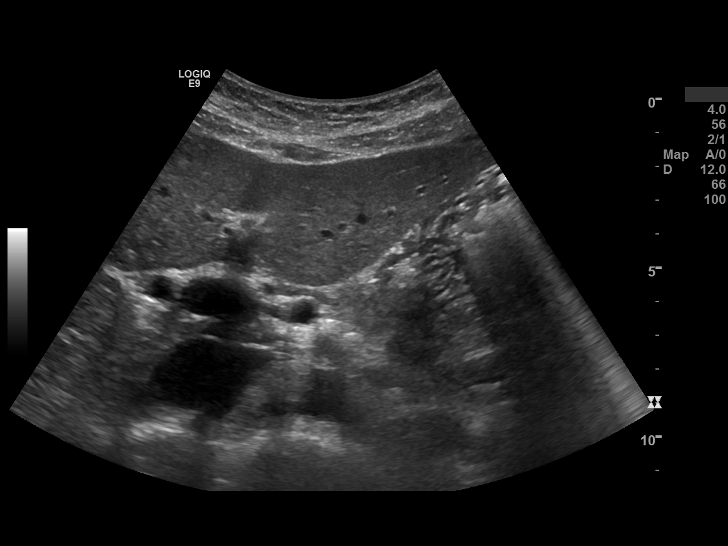
[im 32/76]
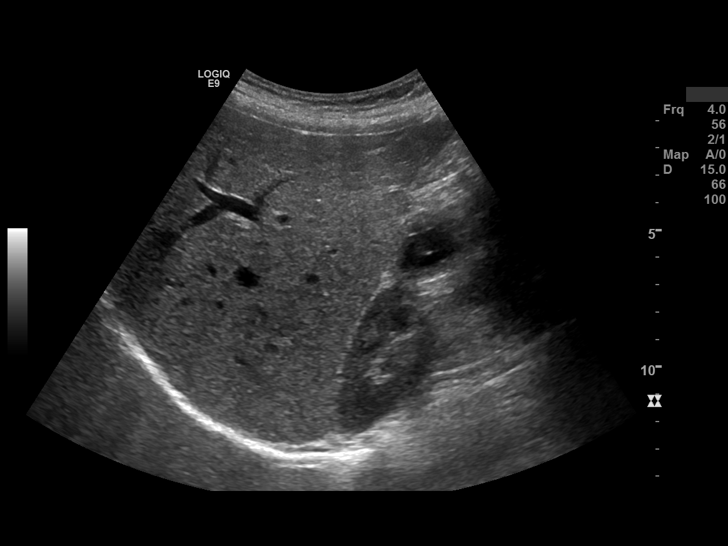
[im 38/76]
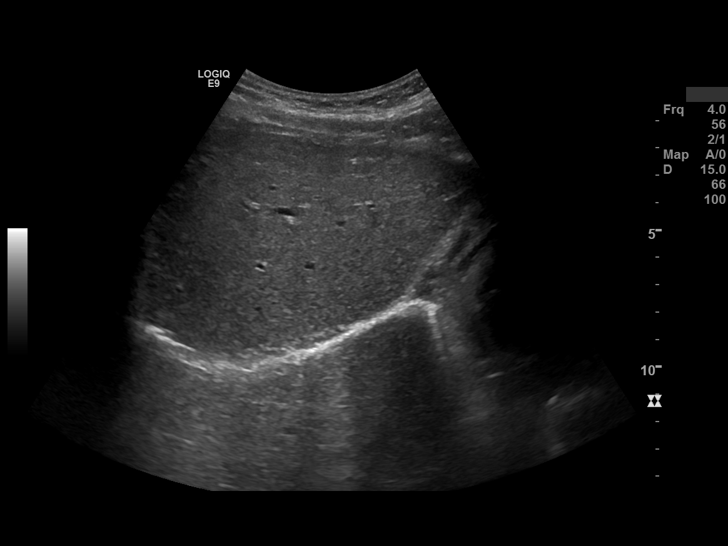
[im 44/76]
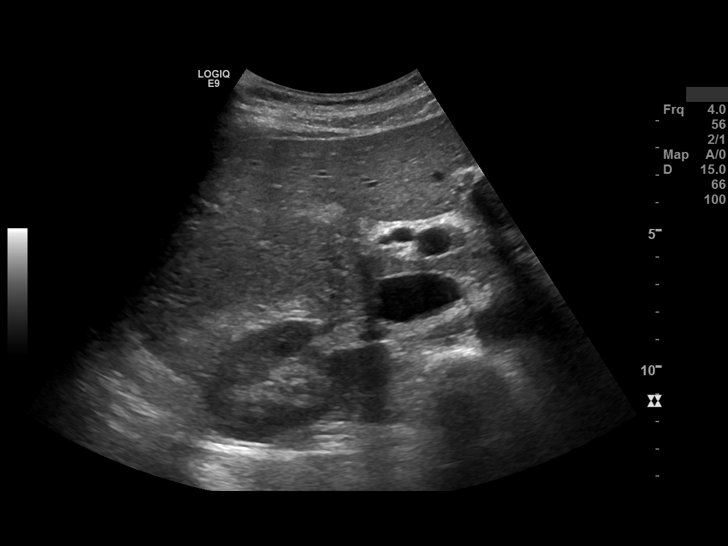
[im 51/76]
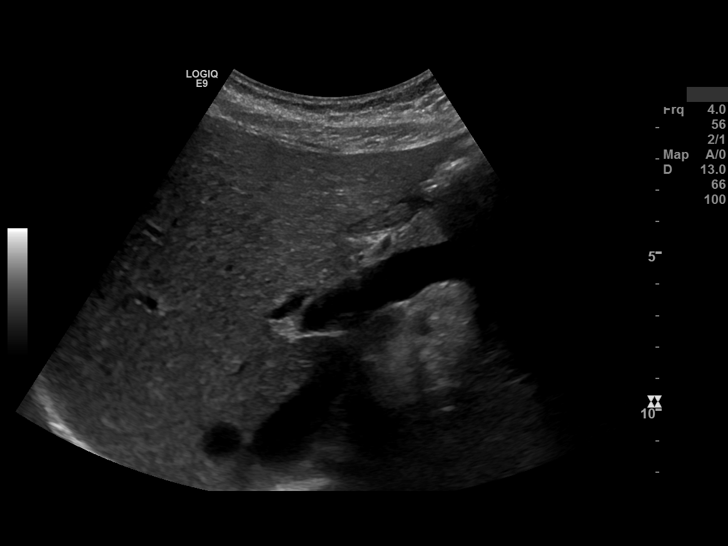
[im 57/76]
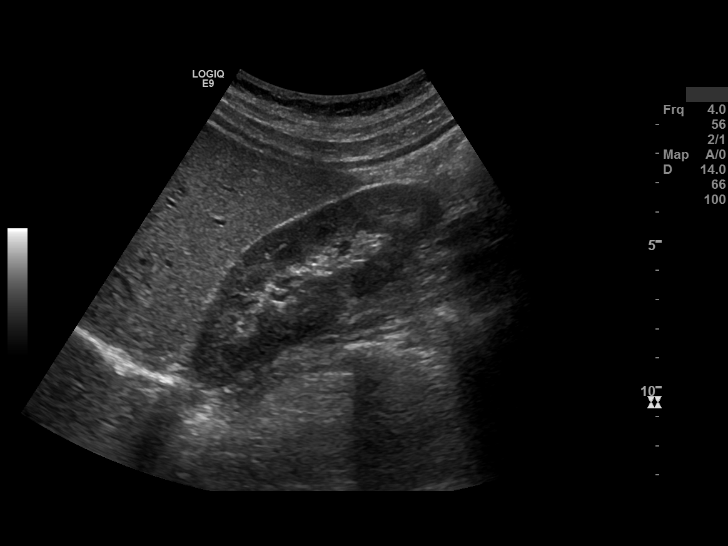
[im 63/76]
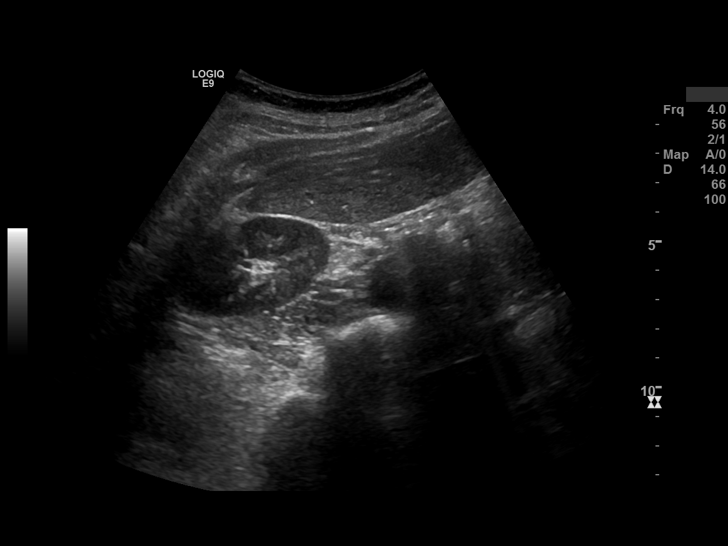
[im 69/76]
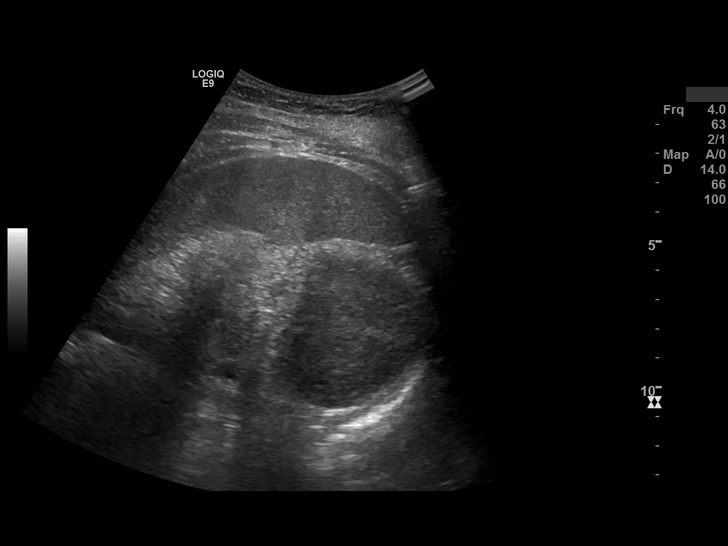
[im 76/76]
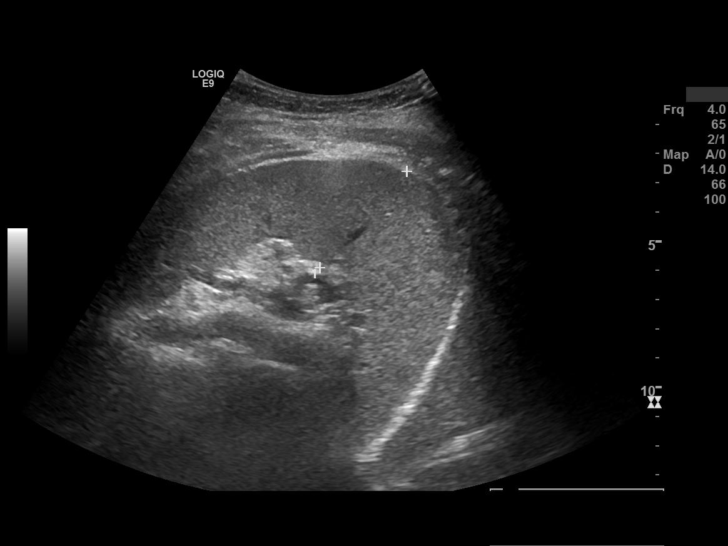

[13 of 25 positions shown; findings below may reference images not displayed]

FINDINGS: The visualized portion of the pancreas is unremarkable. The visualized portion of the abdominal aorta and inferior vena cava is unremarkable.

Liver: Length of  136 mm in the right midclavicular line. The liver echogenicity is mildly increased. Hepatopetal flow is identified in the portal vein. No focal masses identified. No intrahepatic biliary ductal dilatation is seen. 

Gallbladder: Cholecystectomy. The common bile duct measures 8.24  mm.

Spleen measures 111 x 42 x 44 mm and demonstrates normal echotexture without focal lesions. Right kidney measures 104 x 39 x 43 mm. The right kidney volume measures 91.1 ml.

Left kidney measures 133 x 53 x 52 mm. The left kidney volume measures 191.0 ml.

Kidneys are sonographically normal in appearance.  No focal mass, hydronephrosis, nephrolithiasis or cortical thinning is identified.

No ascites is seen.
IMPRESSION: Mildly increased liver echogenicity, which may be seen with steatosis or chronic hepatocellular disease. No focal lesion.

Mildly prominent common bile duct likely related to cholecystectomy.

## 2019-11-16 IMAGING — CT CT BRAIN WO/W CONTRAST
3 of 6 series · 13 of 47 positions shown, 15 images · IV contrast (isovue)
Comparison: None

HISTORY: 43-year-old female with headaches.
TECHNIQUE: CT of the head was obtained without and with contrast.

CONTRAST: IV contrast:  100 mL Isovue 300.

[Series 2: head w/o soft tissue · axial · non-contrast · 0.30mm/px · z∈[-626,-501]mm · 8 of 52 slices shown, 10 images]
[im 4/52  brain]
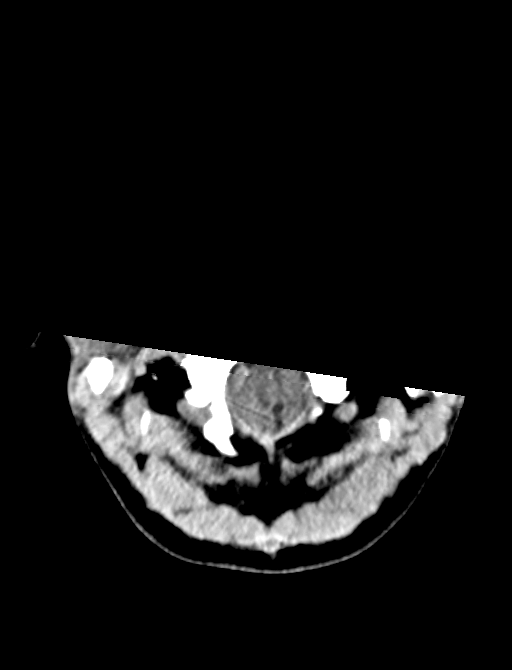
[im 4/52  bone]
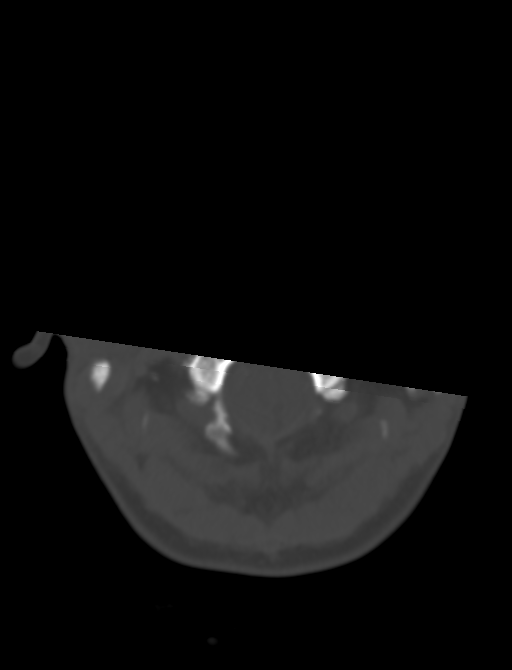
[im 11/52  brain]
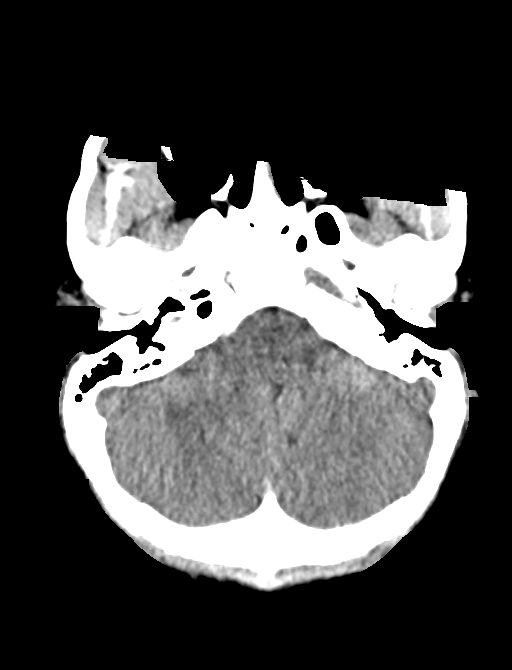
[im 19/52  brain]
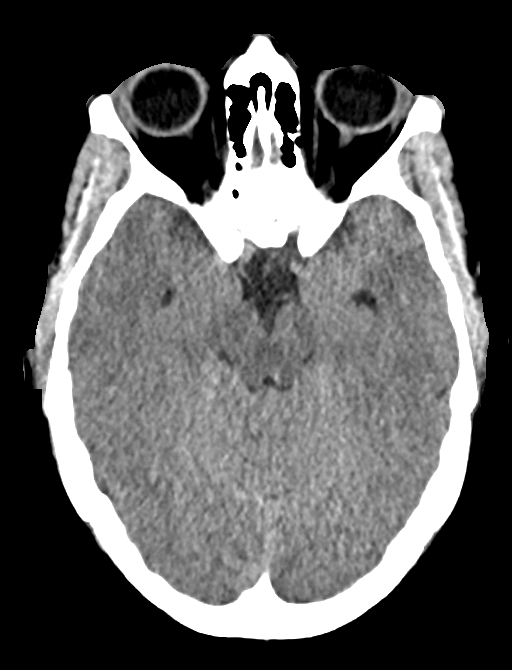
[im 22/52  brain]
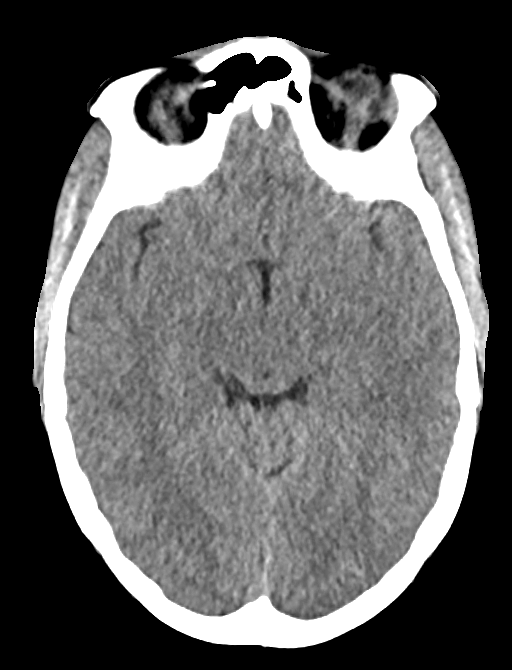
[im 30/52  brain]
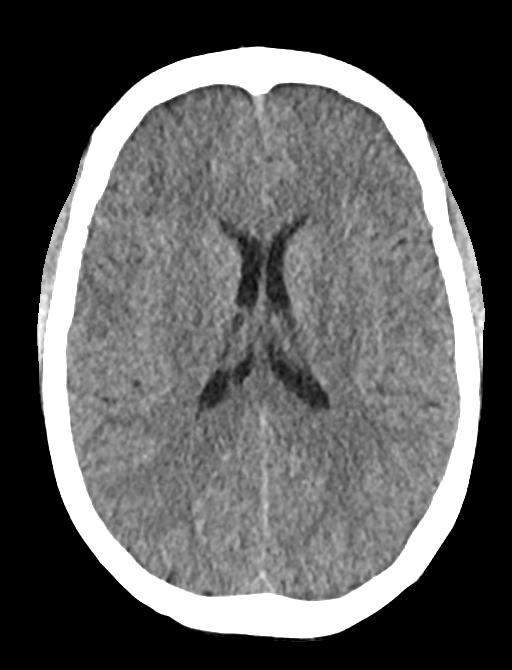
[im 30/52  bone]
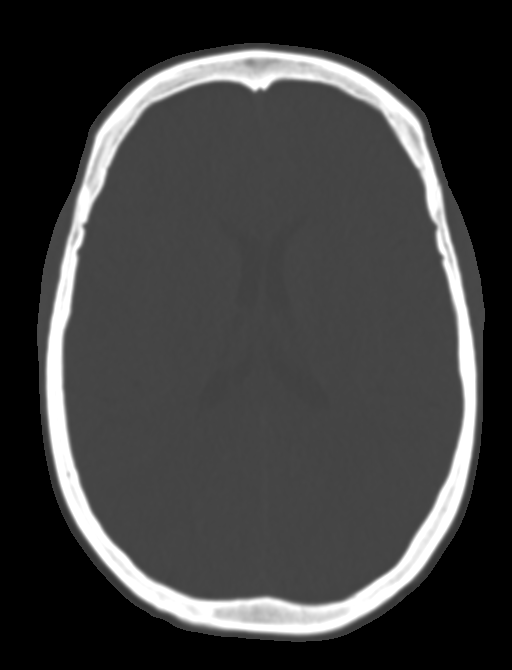
[im 33/52  brain]
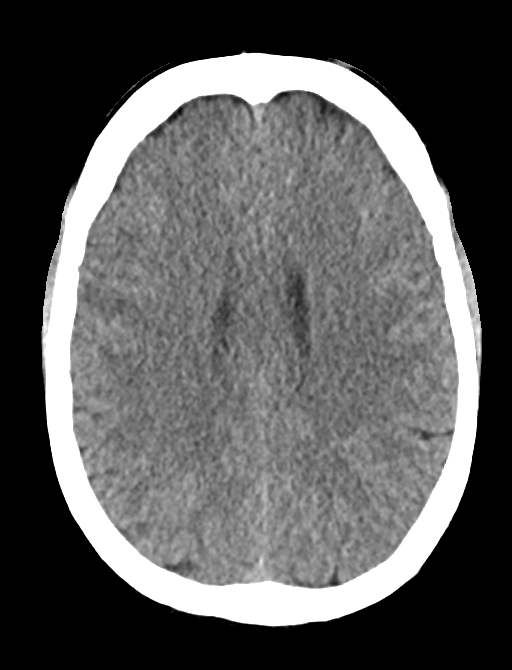
[im 41/52  brain]
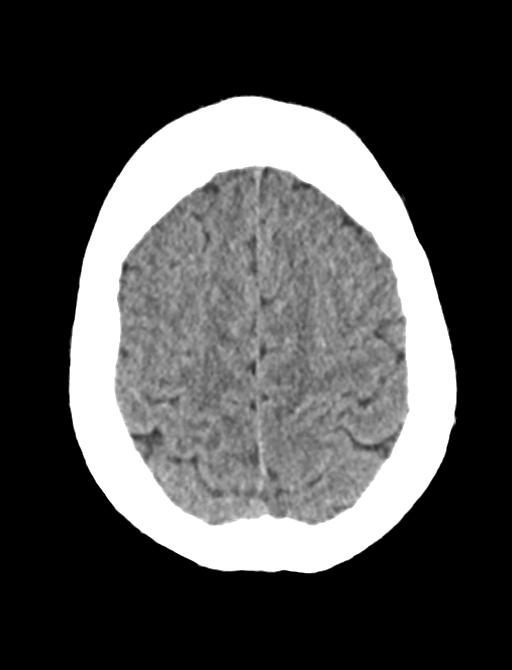
[im 48/52  brain]
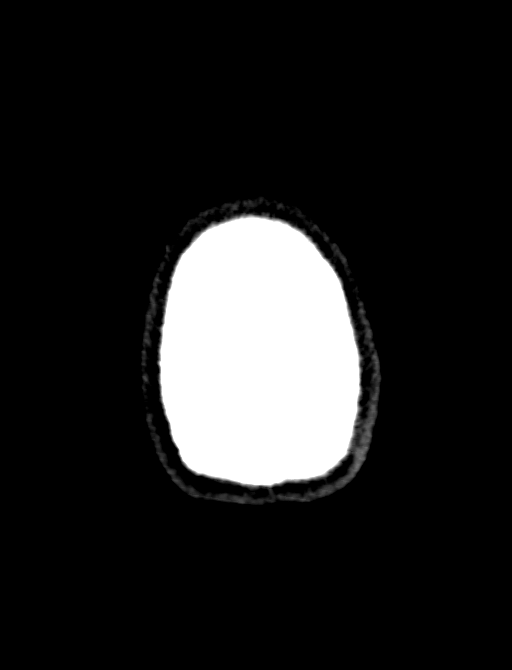

[Series 4: coronal pre · coronal · non-contrast · 0.29mm/px · 3 of 66 slices shown]
[im 17/66  brain]
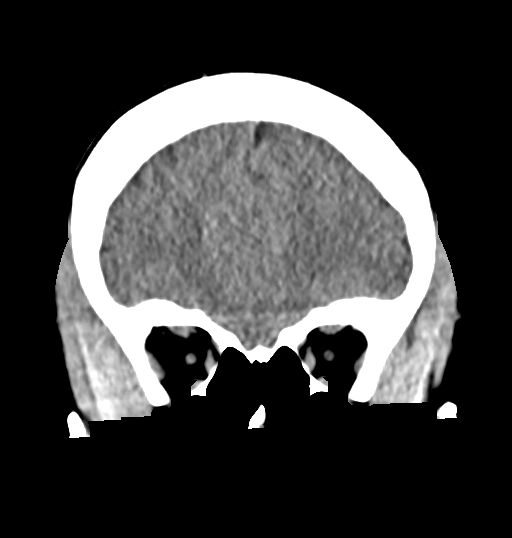
[im 33/66  brain]
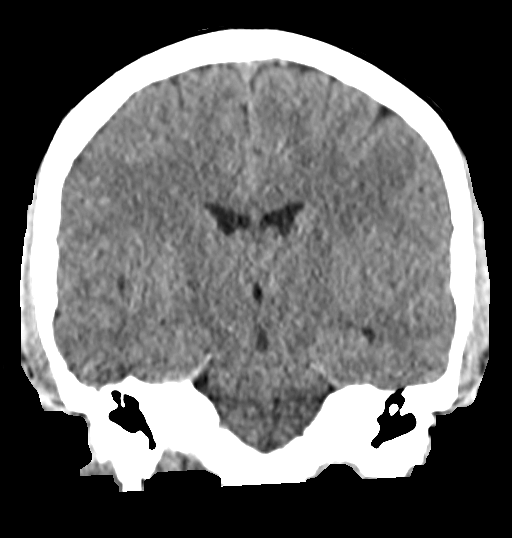
[im 49/66  brain]
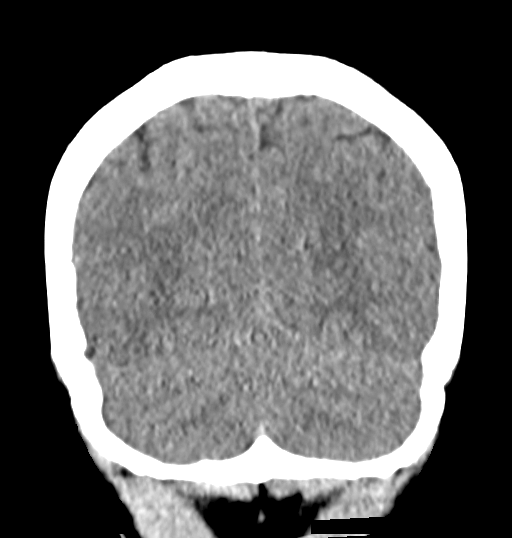

[Series 14: sagittal · sagittal · 0.31mm/px · 2 of 49 slices shown]
[im 17/49  brain]
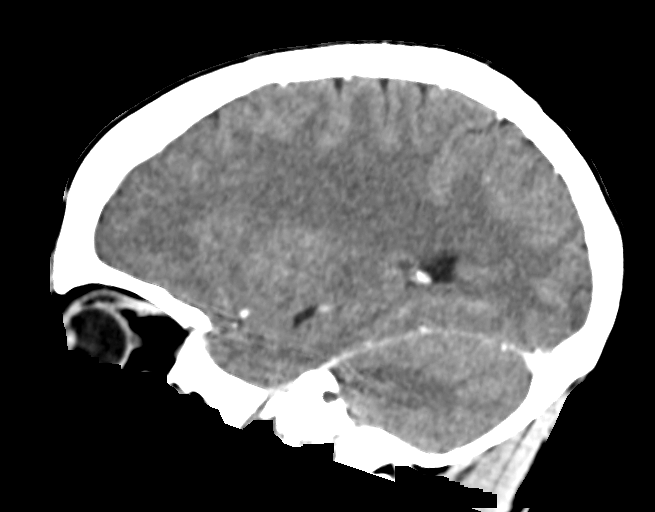
[im 33/49  brain]
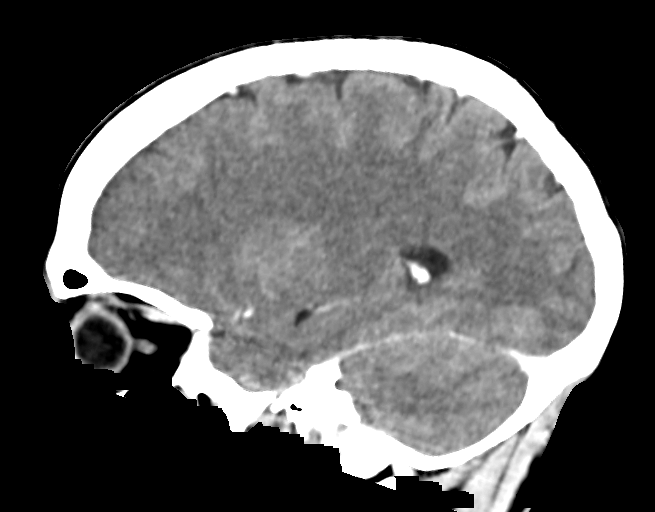

[13 of 47 positions shown; findings below may reference images not displayed]

FINDINGS: BRAIN PARENCHYMA: There is normal differentiation of the gray-white matter. Suspected Chiari I malformation with extrusion of the cerebellar tonsils through the foramen magnum by at least 5 mm. No abnormal intracranial enhancement.

HEMORRHAGE: No acute intracranial hemorrhagic products.

EXTRA-AXIAL SPACES: No intra- or extra-axial fluid collections. Basilar cisterns are maintained.

VENTRICULAR SYSTEM: No hydrocephalus, midline shift or herniation.

BONES: The calvarium is intact. Visualized paranasal sinuses and mastoid air cells are clear.

SOFT TISSUES: Ill-defined soft tissue density in the subcutaneous tissues of the right suboccipital region measuring 9 x 5 mm is nonspecific but does not demonstrate aggressive or suspicious features.
IMPRESSION: 1.
No acute intracranial abnormality.

2.
Possible Chiari I malformation. No hydrocephalus.

3.
9 mm nonspecific subcutaneous nodule in the right suboccipital region is nonspecific but does not demonstrate aggressive or suspicious features.

Total radiation dose to patient is CTDIvol 70.57 mGy and DLP 1146.73 mGy-cm.

## 2019-12-23 IMAGING — MR MRI BRAIN WO CONTRAST
8 series · 32 of 48 positions shown · IV contrast (agent unspecified)
Comparison: CT head without and with contrast 11/16/2019.

HISTORY: 43-year-old female with Chiari l malformation.
TECHNIQUE: Multiplanar, multisequence MRI images of the brain are obtained without intravenous contrast.

CONTRAST: None

[Series 5: flair_axial_fs · axial · 4.0mm · 0.90mm/px · z∈[-83,+75]mm · 3 of 32 slices shown]
[im 1/32]
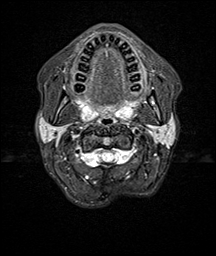
[im 16/32]
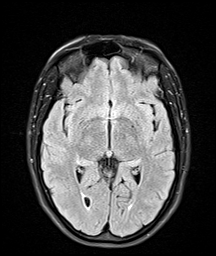
[im 32/32]
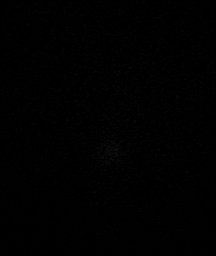

[Series 6: t2_axial · axial · 4.0mm · 0.36mm/px · z∈[-83,+75]mm · 2 of 32 slices shown]
[im 1/32]
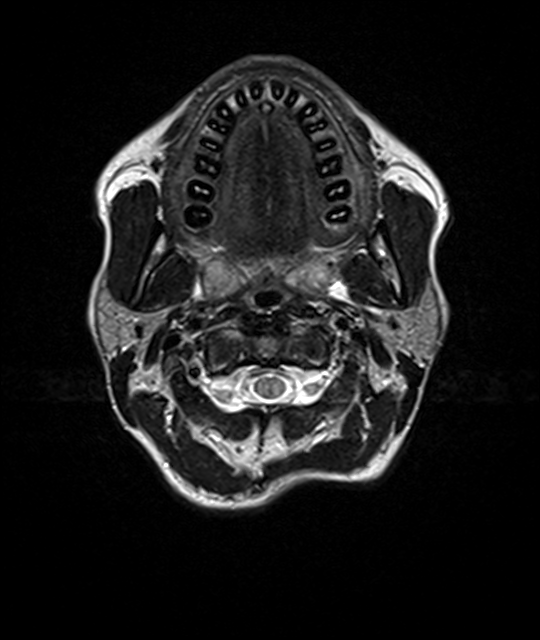
[im 32/32]
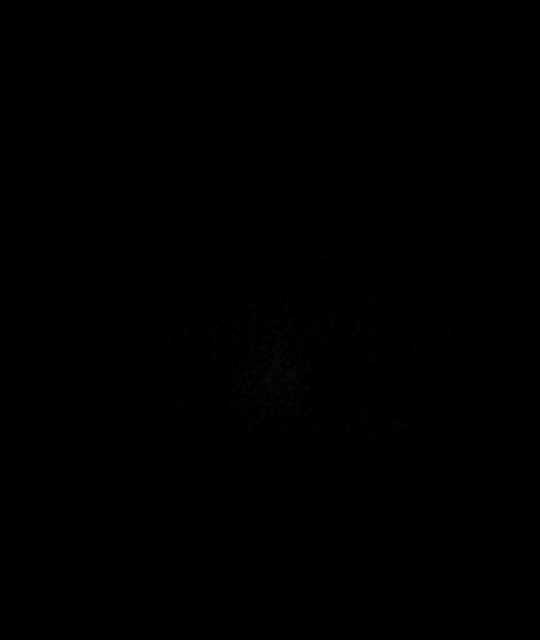

[Series 7: DWI · axial · 4.0mm · 1.31mm/px · z∈[-82,+76]mm · 2 of 32 slices shown (1 of 2)]
[im 1/32]
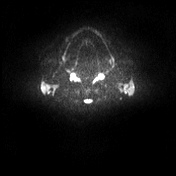
[im 32/32]
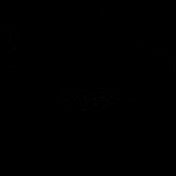

[Series 8: DWI · axial · 4.0mm · 1.31mm/px · z∈[-82,+71]mm · 2 of 31 slices shown (2 of 2)]
[im 1/31]
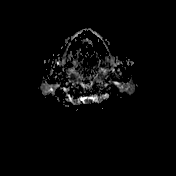
[im 31/31]
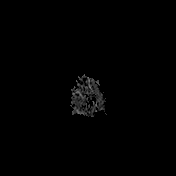

[Series 9: flash_axial · axial · 4.0mm · 0.90mm/px · z∈[-83,+75]mm · 2 of 32 slices shown]
[im 1/32]
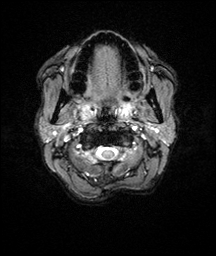
[im 32/32]
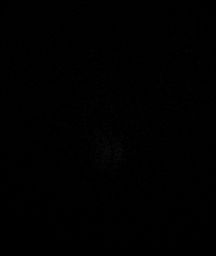

[Series 10: mprage_pre (no fs) · axial · 0.9mm · 0.92mm/px · z∈[-86,+81]mm · 8 of 189 slices shown]
[im 1/189]
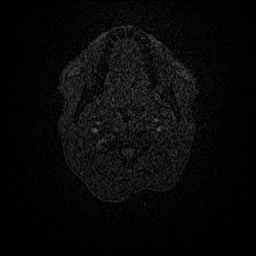
[im 32/189]
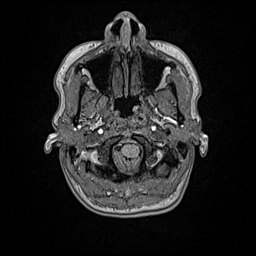
[im 63/189]
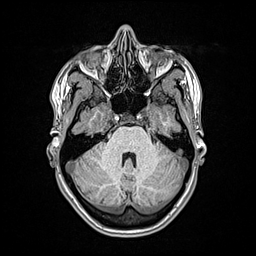
[im 79/189]
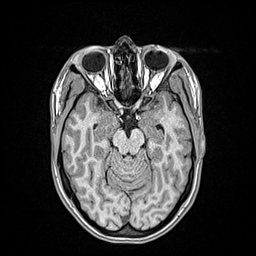
[im 110/189]
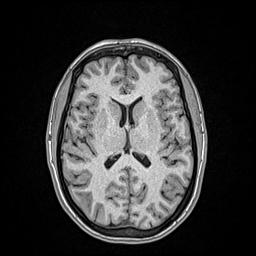
[im 126/189]
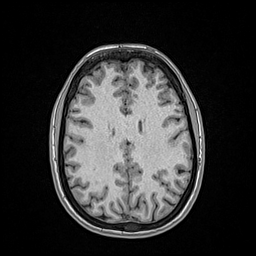
[im 157/189]
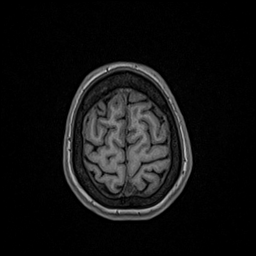
[im 189/189]
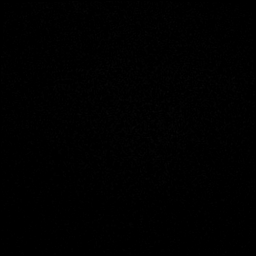

[Series 11: mprage_pre (no fs)_mpr_sagittal · sagittal · 1.0mm · 0.45mm/px · 8 of 150 slices shown]
[im 1/150]
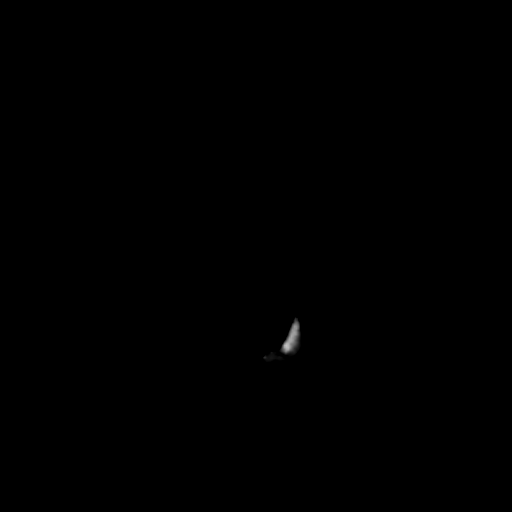
[im 30/150]
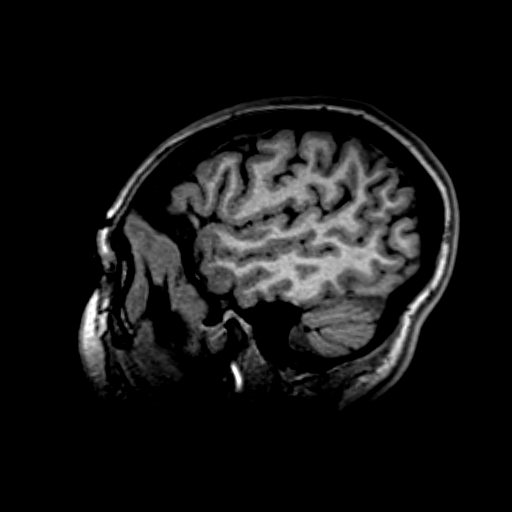
[im 45/150]
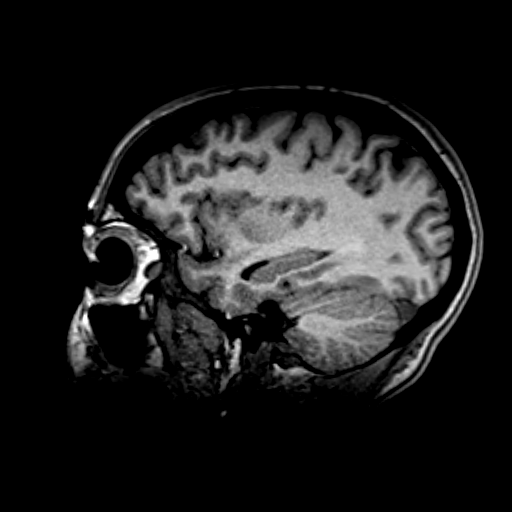
[im 60/150]
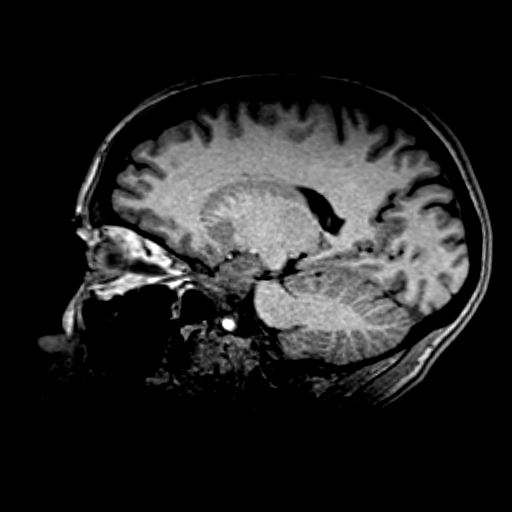
[im 90/150]
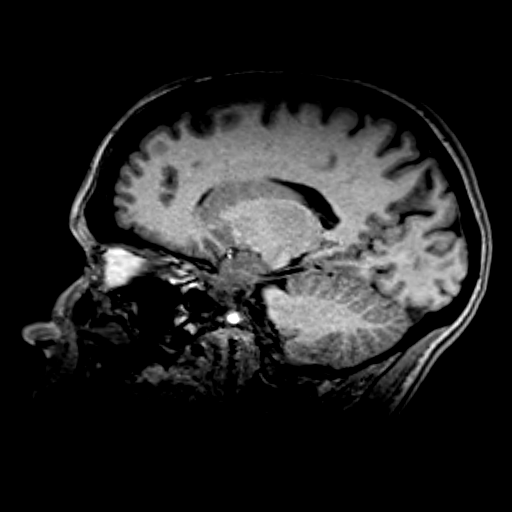
[im 105/150]
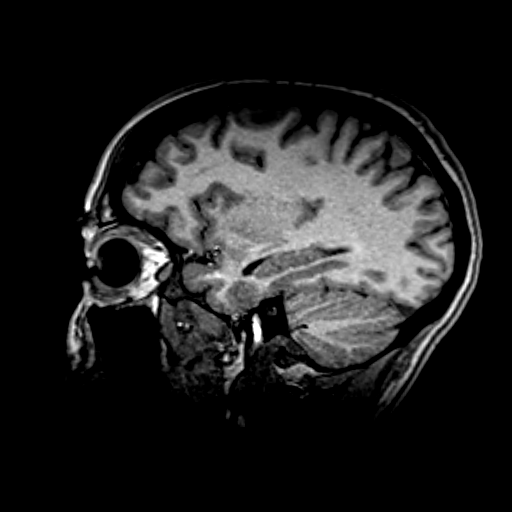
[im 120/150]
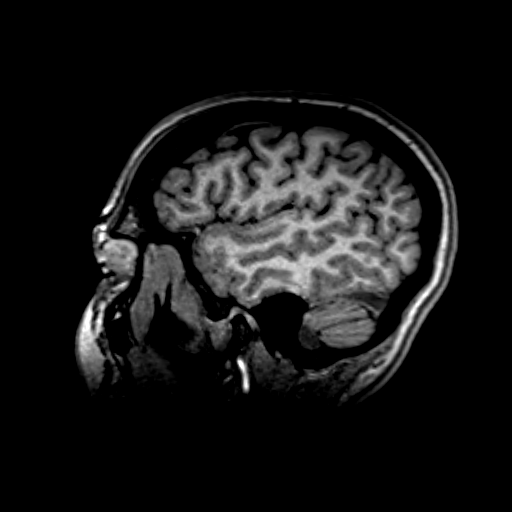
[im 150/150]
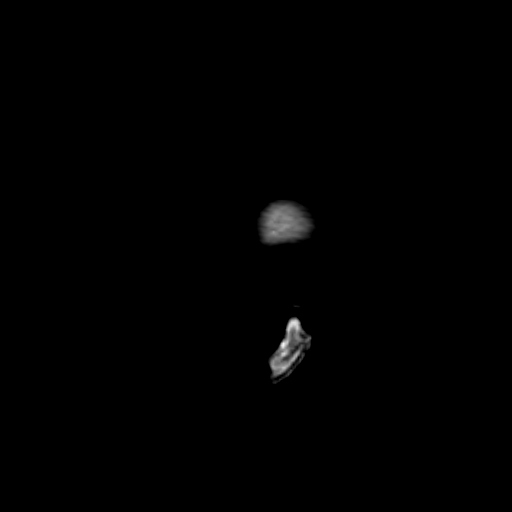

[Series 12: mprage_pre (no fs)_mpr_coronal · coronal · 1.0mm · 0.43mm/px · 5 of 176 slices shown]
[im 1/176]
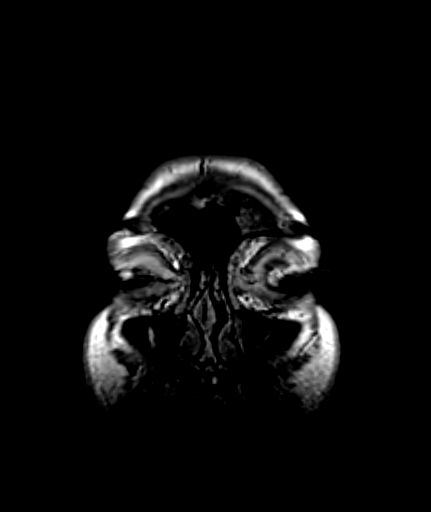
[im 30/176]
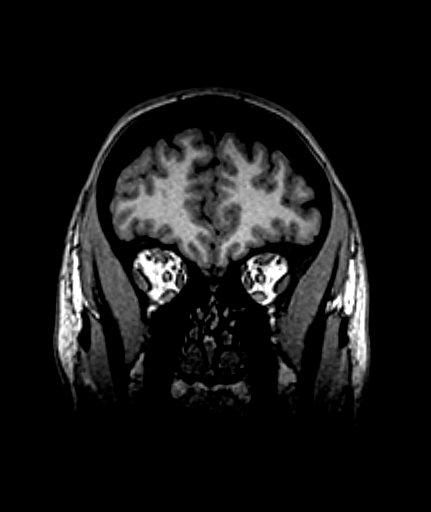
[im 59/176]
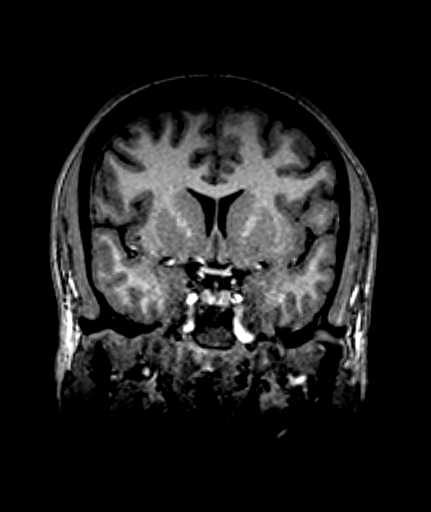
[im 73/176]
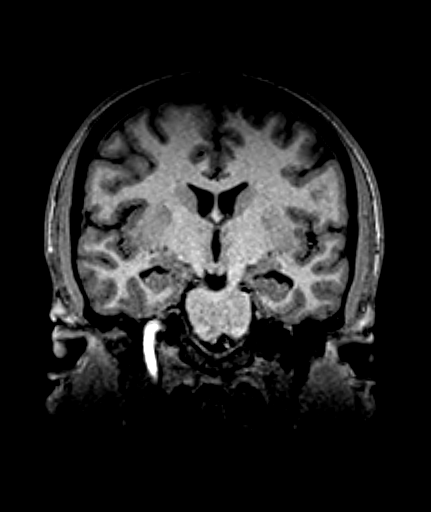
[im 103/176]
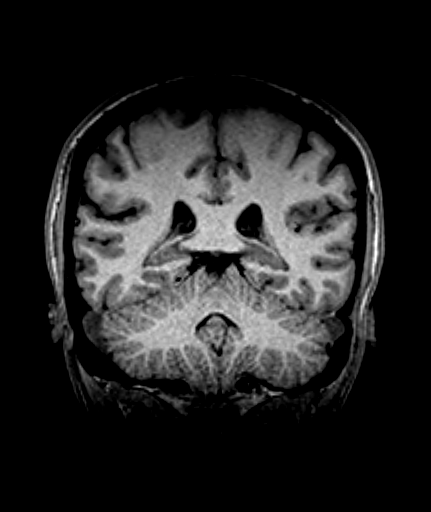

[32 of 48 positions shown; findings below may reference images not displayed]

FINDINGS: BRAIN PARENCHYMA: No acute infarct. No abnormal intracranial signal or susceptibility. The cerebellar tonsils protrude through the foramen magnum to a depth of about 6 mm suggestive of a mild Chiari I malformation. No significant mass effect on the craniocervical junction.

PITUITARY: Unremarkable.

VASCULATURE: Expected major intracranial flow voids are present.

VENTRICULAR SYSTEM: No hydrocephalus or midline shift.

EXTRA-AXIAL SPACES: No intra- or extra-axial fluid collections.

ORBITS: Unremarkable.

PARANASAL SINUSES AND MASTOID AIR CELLS: Visualized paranasal sinuses and mastoid air cells are predominantly clear.

BONES: Unremarkable.
IMPRESSION: Chiari I malformation. No significant mass effect on the craniocervical junction.

## 2019-12-28 IMAGING — MR MRI CSPINE WO/W CONTRAST
7 of 10 series · 31 of 48 positions shown · IV contrast (prohance)
Comparison: Brain MRI without contrast 12/23/2019

HISTORY: 43-year-old female with cervicalgia.
TECHNIQUE: Multiplanar, multisequence MR images of the cervical spine were obtained before and after administration of intravenous contrast.

CONTRAST: 15 mL of ProHance.

[Series 1: bSSFP · axial · 10.0mm · 1.17mm/px · z∈[-60,+153]mm · 4 of 14 slices shown]
[im 1/14]
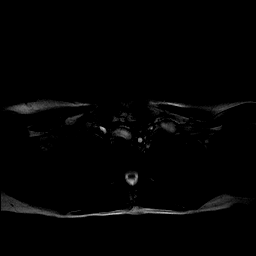
[im 5/14]
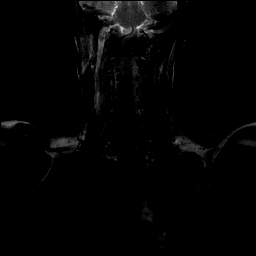
[im 9/14]
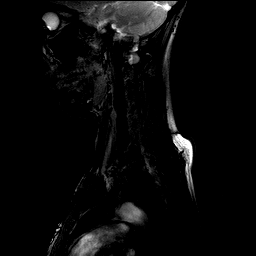
[im 14/14]
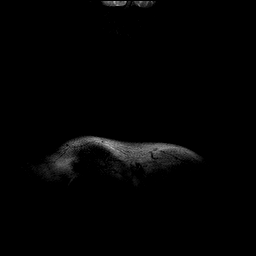

[Series 5: t1_sag · sagittal · 3.0mm · 0.86mm/px · 4 of 17 slices shown]
[im 1/17]
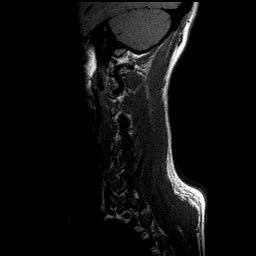
[im 6/17]
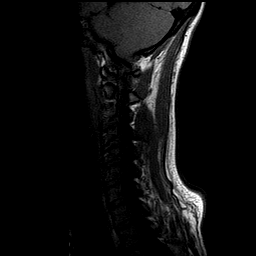
[im 11/17]
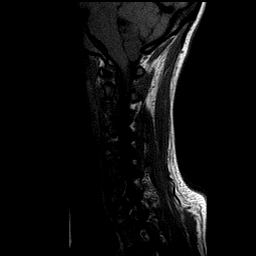
[im 17/17]
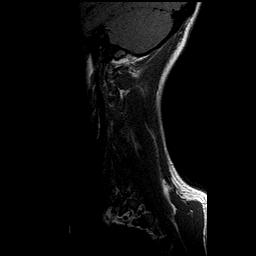

[Series 6: ir_sag · sagittal · 3.0mm · 0.86mm/px · 3 of 17 slices shown]
[im 1/17]
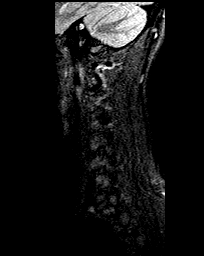
[im 9/17]
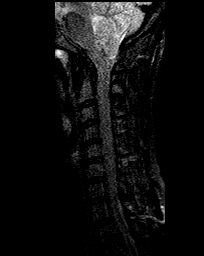
[im 17/17]
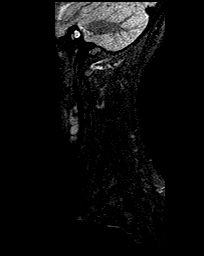

[Series 7: t2_sag_r3 · sagittal · 3.0mm · 0.69mm/px · 3 of 17 slices shown]
[im 1/17]
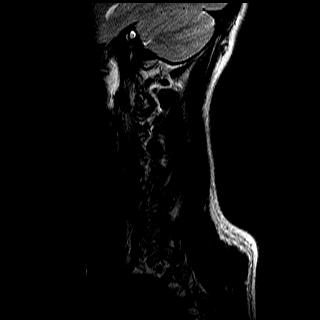
[im 9/17]
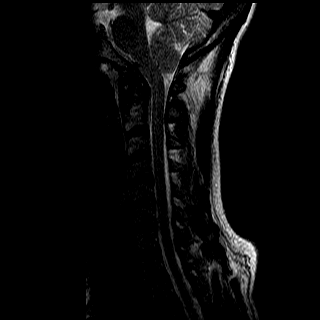
[im 17/17]
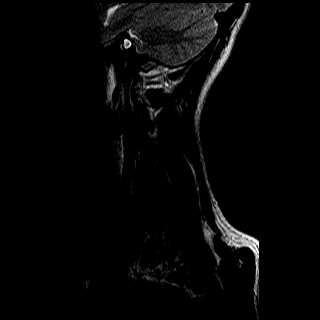

[Series 8: ir_sag_r1 · sagittal · 3.0mm · 0.86mm/px · 3 of 17 slices shown]
[im 1/17]
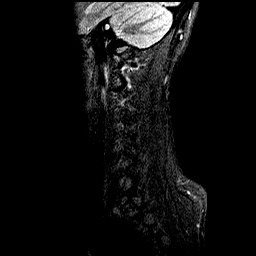
[im 9/17]
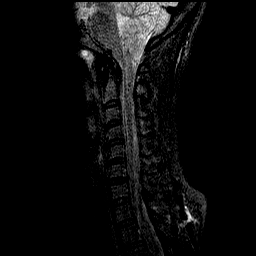
[im 17/17]
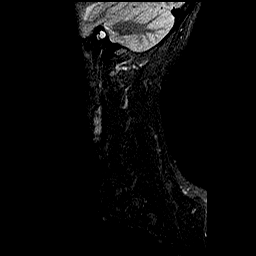

[Series 9: t2_medic_axial · axial · 3.0mm · 0.35mm/px · z∈[-45,+101]mm · 7 of 36 slices shown]
[im 1/36]
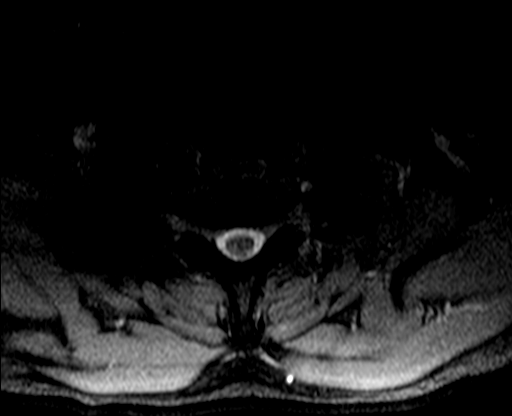
[im 6/36]
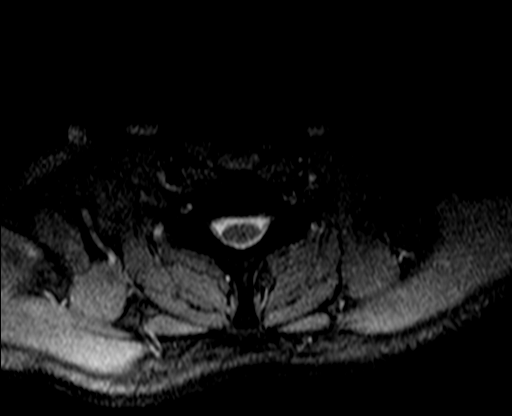
[im 12/36]
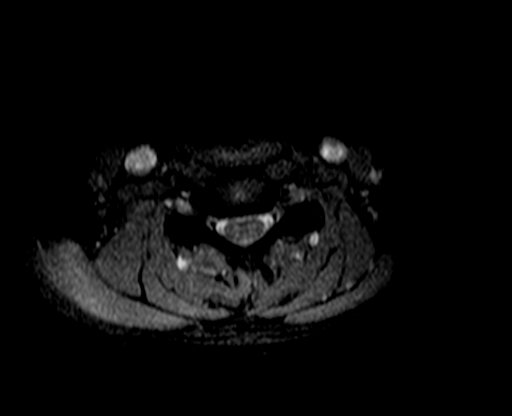
[im 18/36]
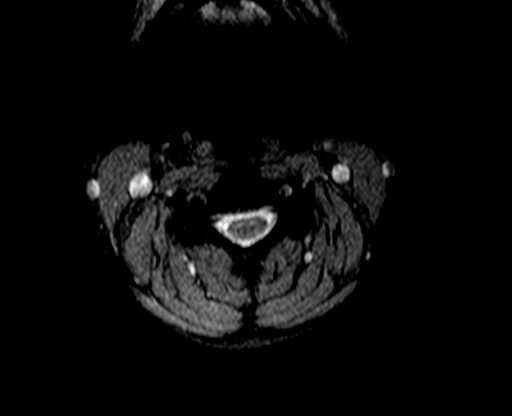
[im 24/36]
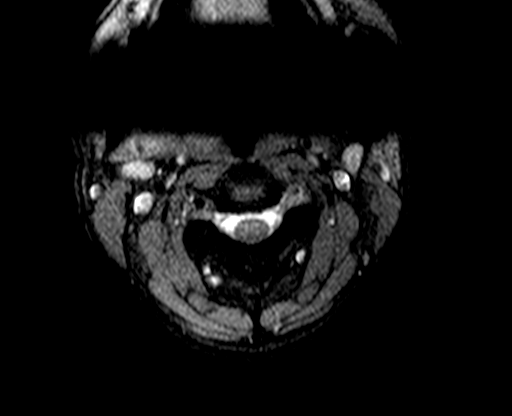
[im 30/36]
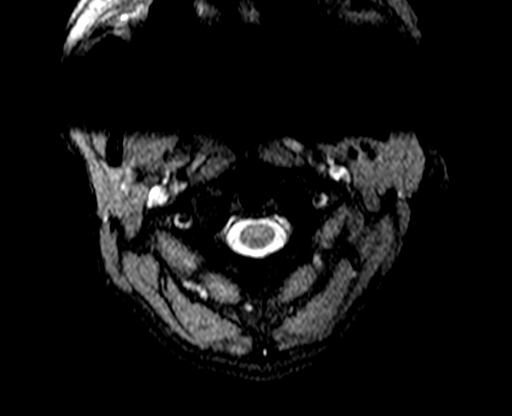
[im 36/36]
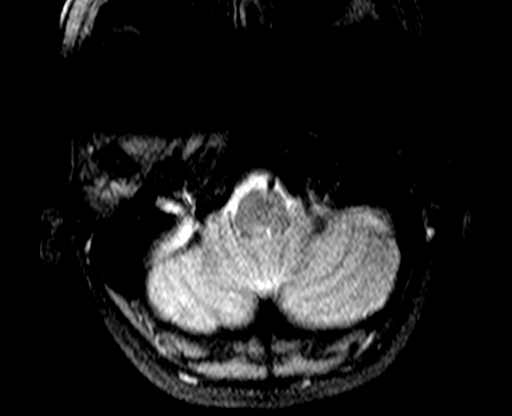

[Series 10: t2_axial · axial · 3.0mm · 0.28mm/px · z∈[-45,+101]mm · 7 of 36 slices shown]
[im 1/36]
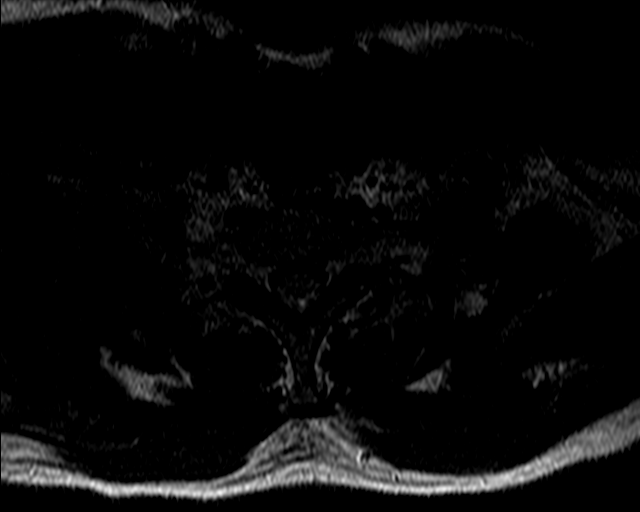
[im 6/36]
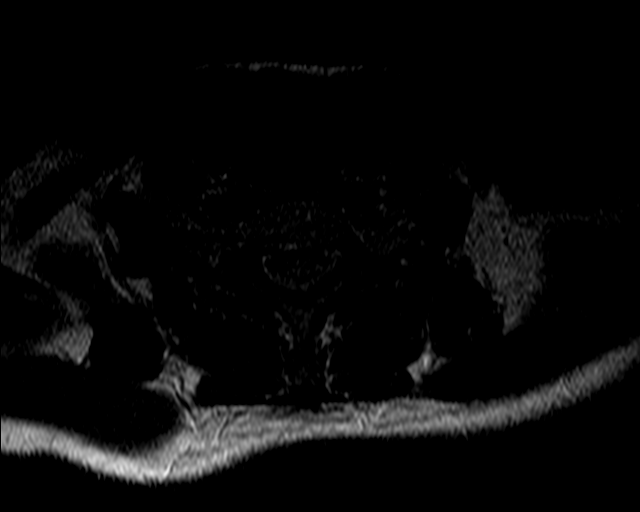
[im 12/36]
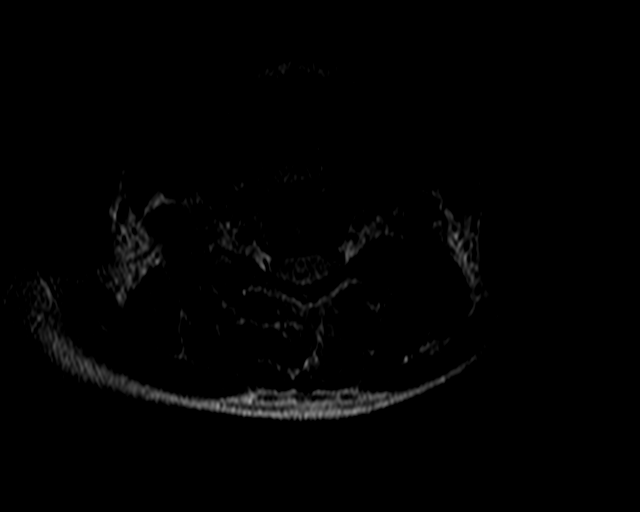
[im 18/36]
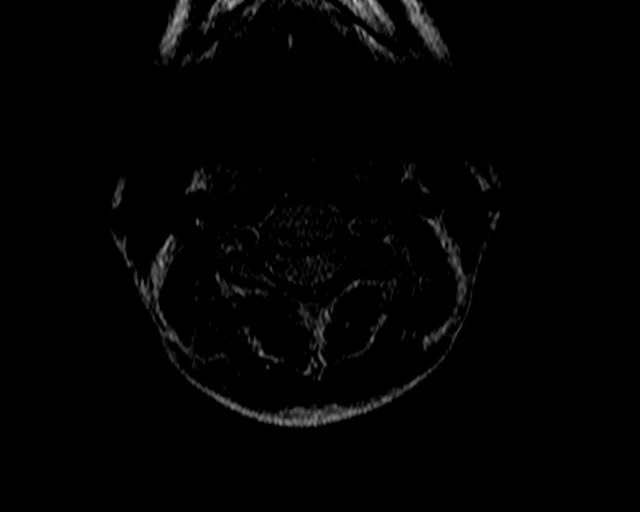
[im 24/36]
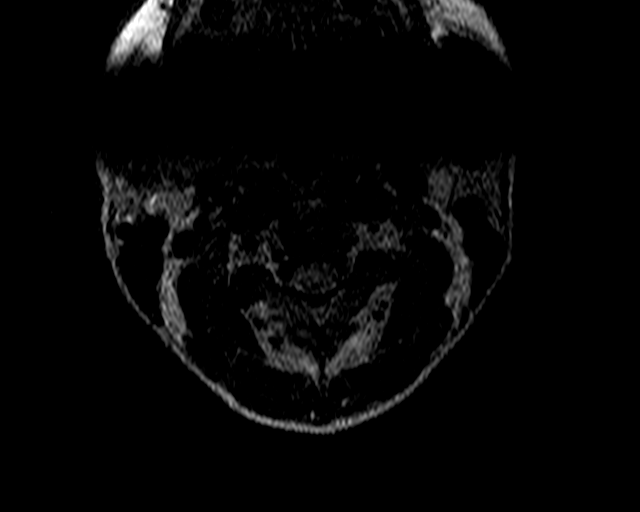
[im 30/36]
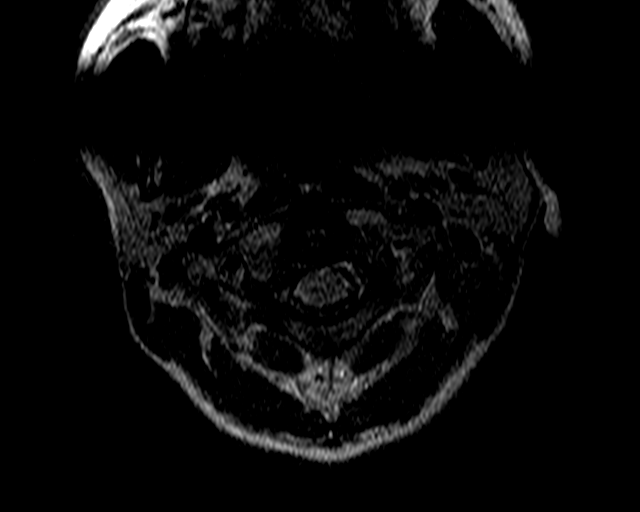
[im 36/36]
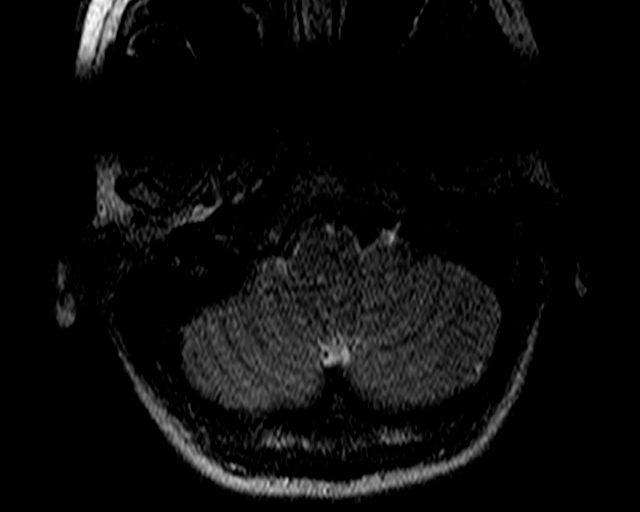

[31 of 48 positions shown; findings below may reference images not displayed]

FINDINGS: ALIGNMENT: There is straightening of the cervical lordosis. No subluxations.

VERTEBRAL BODY HEIGHTS: Maintained.

MARROW SIGNAL: No marrow signal changes.

INTERVERTEBRAL DISCS: Multilevel desiccation without significant height loss.

PARASPINAL SOFT TISSUES: No paraspinal soft tissue signal abnormalities.

SPINAL CORD: Normal in signal and caliber.

VISUALIZED POSTERIOR FOSSA: Redemonstrated Chiari I malformation.

Findings by level:

C2-C3:  No high-grade canal stenosis or foraminal narrowing.

C3-C4:  No high-grade canal stenosis or foraminal narrowing.

C4-C5:  No high-grade canal stenosis or foraminal narrowing.

C5-C6:  No high-grade canal stenosis or foraminal narrowing.

C6-C7:  No high-grade canal stenosis or foraminal narrowing.

C7-T1:  No high-grade canal stenosis or foraminal narrowing.
IMPRESSION: Mild Chiari I malformation. No spinal cord syrinx.

## 2019-12-28 IMAGING — MR MRI TSPINE WO/W CONTRAST
8 of 9 series · 46 of 48 positions shown · IV contrast (prohance)
Comparison: MRI cervical spine study dated 12/28/2019.

HISTORY: 43 year-old female with thoracic pain.
TECHNIQUE: MRI study of the thoracic spine was performed using sagittal and axial images of varying sequences. The examination is performed first without contrast followed by IV contrast.

IV contrast: 15 mL ProHance.

[Series 2: bSSFP · coronal · 10.0mm · 0.98mm/px · 2 of 8 slices shown]
[im 1/8]
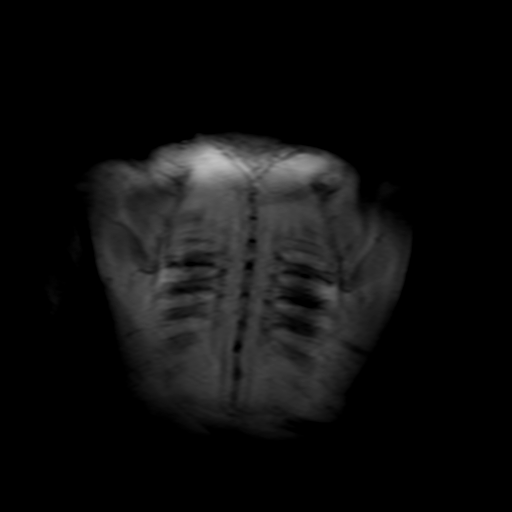
[im 8/8]
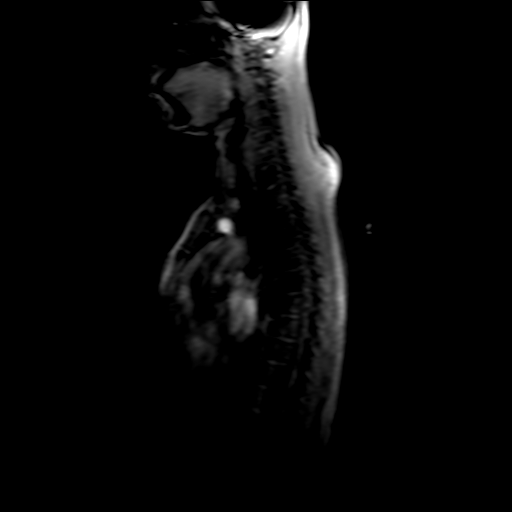

[Series 4: t2_sag · sagittal · 4.0mm · 0.94mm/px · 2 of 11 slices shown]
[im 1/11]
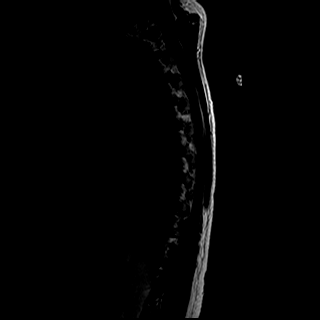
[im 11/11]
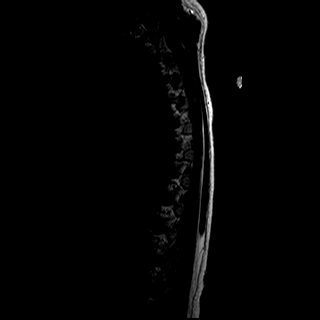

[Series 5: t1_sag · sagittal · 4.0mm · 1.00mm/px · 2 of 11 slices shown]
[im 1/11]
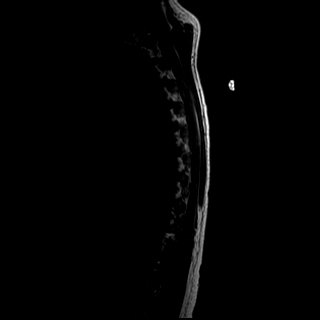
[im 11/11]
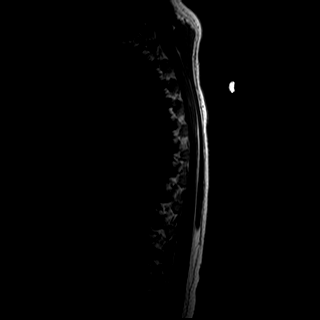

[Series 6: ir_sag · sagittal · 4.0mm · 1.25mm/px · 2 of 11 slices shown]
[im 1/11]
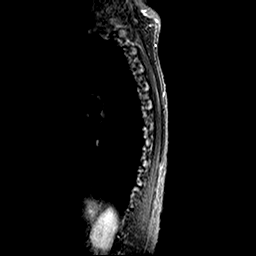
[im 11/11]
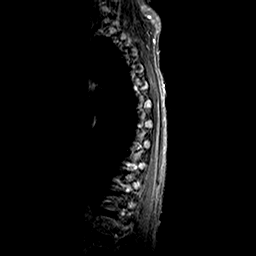

[Series 14: t1_sag_fs_+c · sagittal · 4.0mm · 1.00mm/px · 2 of 11 slices shown]
[im 1/11]
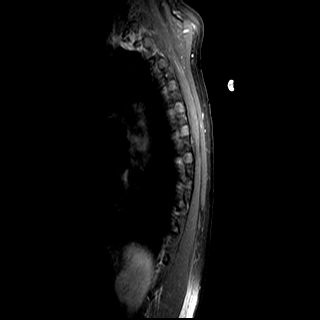
[im 11/11]
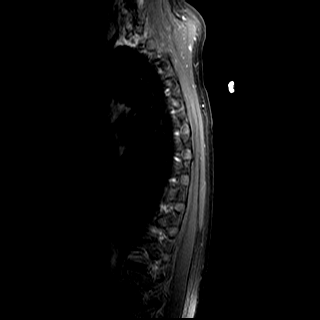

[Series 15: t2_axial · axial · 4.0mm · 0.62mm/px · z∈[-173,+98]mm · 12 of 60 slices shown]
[im 1/60]
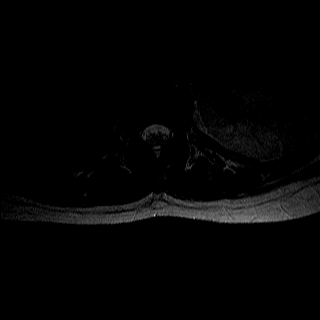
[im 6/60]
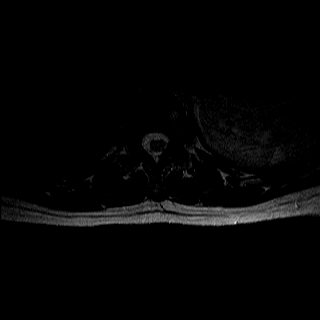
[im 11/60]
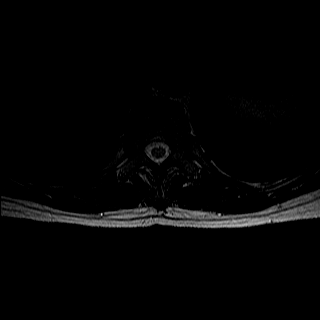
[im 17/60]
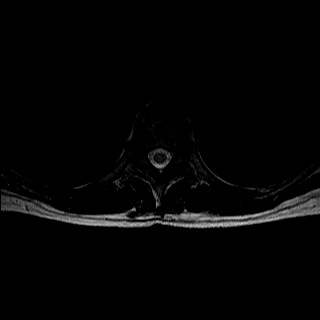
[im 22/60]
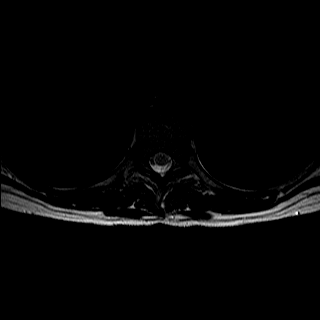
[im 27/60]
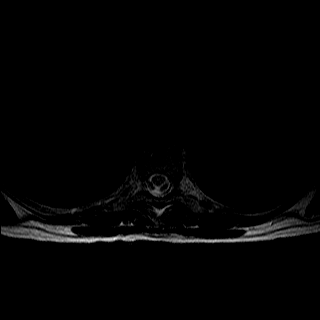
[im 33/60]
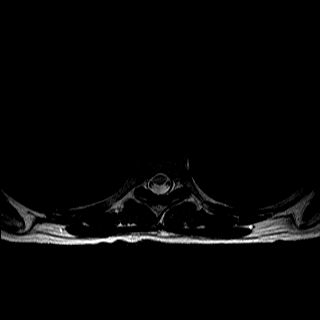
[im 38/60]
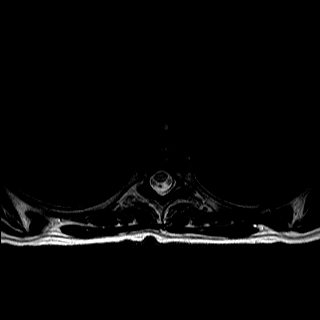
[im 43/60]
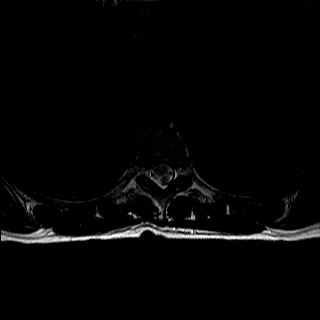
[im 49/60]
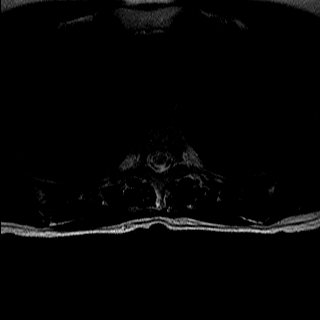
[im 54/60]
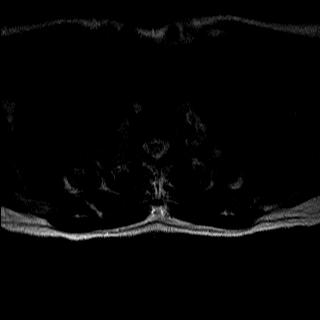
[im 60/60]
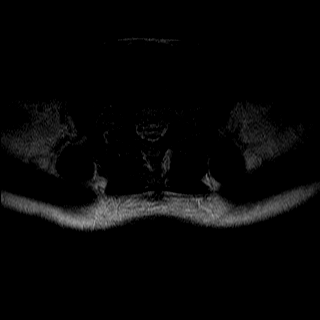

[Series 16: t1_axial_fs_pre · axial · 4.0mm · 0.78mm/px · z∈[-175,+100]mm · 12 of 60 slices shown]
[im 1/60]
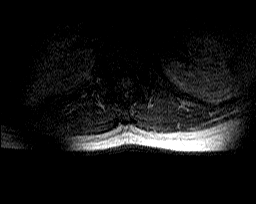
[im 6/60]
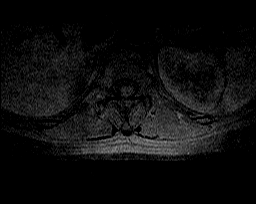
[im 11/60]
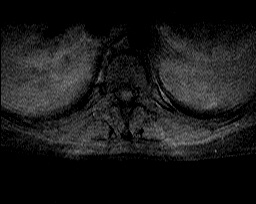
[im 17/60]
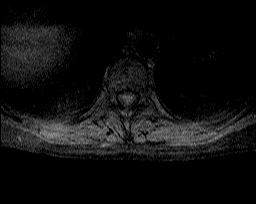
[im 22/60]
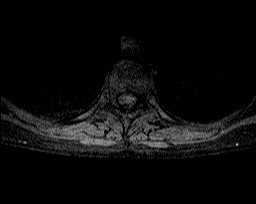
[im 27/60]
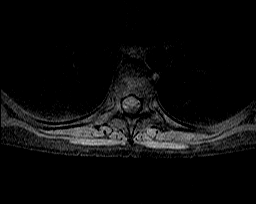
[im 33/60]
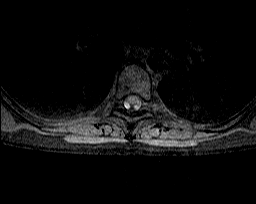
[im 38/60]
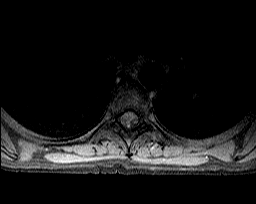
[im 43/60]
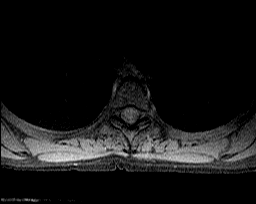
[im 49/60]
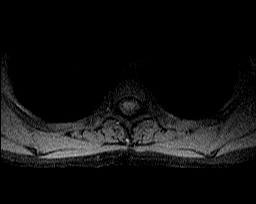
[im 54/60]
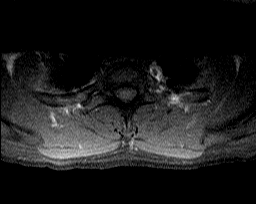
[im 60/60]
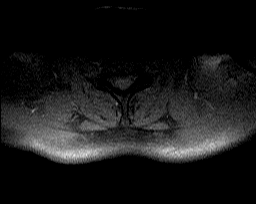

[Series 17: t1_axial_fs_+c · axial · 4.0mm · 0.78mm/px · z∈[-175,+100]mm · 12 of 60 slices shown]
[im 1/60]
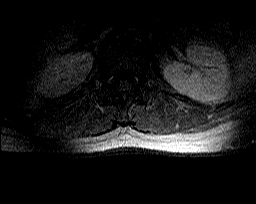
[im 6/60]
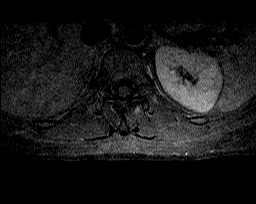
[im 11/60]
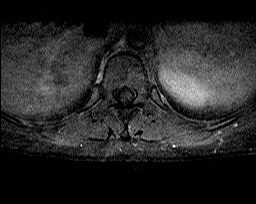
[im 17/60]
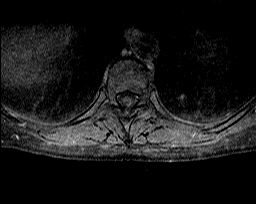
[im 22/60]
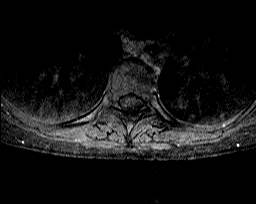
[im 27/60]
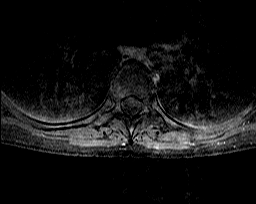
[im 33/60]
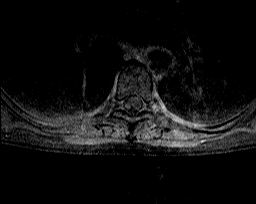
[im 38/60]
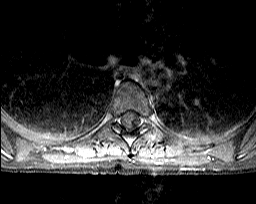
[im 43/60]
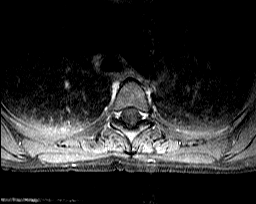
[im 49/60]
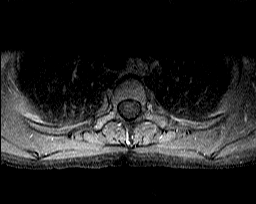
[im 54/60]
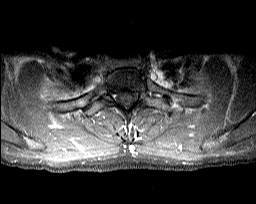
[im 60/60]
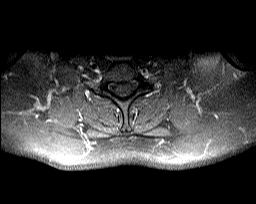

[46 of 48 positions shown; findings below may reference images not displayed]

FINDINGS: Vertebral body height and alignment: Within normal limits.

Marrow signal: The marrow signal is unremarkable.

Degenerative disc changes: No significant degenerative disc changes are present.

Thoracic cord: Within normal limits.

T1-2: The spinal canal and the neural foramina are patent.

T2-3: The spinal canal and the neural foramina are patent.

T3-4: The spinal canal and the neural foramina are patent.

T4-5: The spinal canal and the neural foramina are patent.

T5-6: The spinal canal and the neural foramina are patent.

T6-7: The spinal canal and the neural foramina are patent.

T7-8: The spinal canal and the neural foramina are patent.

T8-9: The spinal canal and the neural foramina are patent.

T9-10: The spinal canal and the neural foramina are patent.

T10-11: The spinal canal and the neural foramina are patent.

T11-12: The spinal canal and the neural foramina are patent.

T12-L1: The spinal canal and the neural foramina are patent.
IMPRESSION: Negative thoracic spine study.
